# Patient Record
Sex: Male | Born: 1960 | Race: Black or African American | Hispanic: No | Marital: Married | Smoking: Former smoker
Health system: Southern US, Community
[De-identification: ages and names within clinical notes are randomized; demographics above are authoritative.]

## PROBLEM LIST (undated history)

## (undated) DIAGNOSIS — I10 Essential (primary) hypertension: Secondary | ICD-10-CM

## (undated) DIAGNOSIS — C61 Malignant neoplasm of prostate: Secondary | ICD-10-CM

## (undated) HISTORY — PX: OTHER SURGICAL HISTORY: SHX169

## (undated) HISTORY — DX: Malignant neoplasm of prostate: C61

---

## 1999-09-29 ENCOUNTER — Encounter: Admission: RE | Admit: 1999-09-29 | Discharge: 1999-09-29 | Payer: Self-pay | Admitting: *Deleted

## 1999-09-29 ENCOUNTER — Encounter: Payer: Self-pay | Admitting: Infectious Diseases

## 2000-04-17 ENCOUNTER — Emergency Department (HOSPITAL_COMMUNITY): Admission: EM | Admit: 2000-04-17 | Discharge: 2000-04-17 | Payer: Self-pay | Admitting: Emergency Medicine

## 2001-01-25 ENCOUNTER — Encounter: Admission: RE | Admit: 2001-01-25 | Discharge: 2001-01-25 | Payer: Self-pay | Admitting: Family Medicine

## 2001-01-25 ENCOUNTER — Encounter: Payer: Self-pay | Admitting: Family Medicine

## 2001-02-12 ENCOUNTER — Ambulatory Visit (HOSPITAL_COMMUNITY): Admission: RE | Admit: 2001-02-12 | Discharge: 2001-02-12 | Payer: Self-pay | Admitting: *Deleted

## 2001-02-22 ENCOUNTER — Ambulatory Visit (HOSPITAL_COMMUNITY): Admission: RE | Admit: 2001-02-22 | Discharge: 2001-02-22 | Payer: Self-pay | Admitting: *Deleted

## 2001-12-03 ENCOUNTER — Encounter: Payer: Self-pay | Admitting: Endocrinology

## 2001-12-03 ENCOUNTER — Ambulatory Visit (HOSPITAL_COMMUNITY): Admission: RE | Admit: 2001-12-03 | Discharge: 2001-12-03 | Payer: Self-pay | Admitting: Endocrinology

## 2003-07-31 ENCOUNTER — Ambulatory Visit (HOSPITAL_COMMUNITY): Admission: RE | Admit: 2003-07-31 | Discharge: 2003-07-31 | Payer: Self-pay | Admitting: Urology

## 2003-10-13 ENCOUNTER — Ambulatory Visit: Admission: RE | Admit: 2003-10-13 | Discharge: 2004-01-11 | Payer: Self-pay | Admitting: Radiation Oncology

## 2003-12-17 ENCOUNTER — Encounter: Admission: RE | Admit: 2003-12-17 | Discharge: 2003-12-17 | Payer: Self-pay | Admitting: Urology

## 2004-01-22 ENCOUNTER — Ambulatory Visit (HOSPITAL_BASED_OUTPATIENT_CLINIC_OR_DEPARTMENT_OTHER): Admission: RE | Admit: 2004-01-22 | Discharge: 2004-01-22 | Payer: Self-pay | Admitting: Urology

## 2004-01-22 ENCOUNTER — Ambulatory Visit (HOSPITAL_COMMUNITY): Admission: RE | Admit: 2004-01-22 | Discharge: 2004-01-22 | Payer: Self-pay | Admitting: Urology

## 2004-02-12 ENCOUNTER — Ambulatory Visit: Admission: RE | Admit: 2004-02-12 | Discharge: 2004-02-24 | Payer: Self-pay | Admitting: Radiation Oncology

## 2005-05-11 ENCOUNTER — Inpatient Hospital Stay (HOSPITAL_COMMUNITY): Admission: RE | Admit: 2005-05-11 | Discharge: 2005-05-17 | Payer: Self-pay | Admitting: Psychiatry

## 2005-05-12 ENCOUNTER — Ambulatory Visit: Payer: Self-pay | Admitting: Psychiatry

## 2005-09-21 ENCOUNTER — Ambulatory Visit: Admission: RE | Admit: 2005-09-21 | Discharge: 2005-10-13 | Payer: Self-pay | Admitting: Radiation Oncology

## 2005-10-26 ENCOUNTER — Ambulatory Visit: Payer: Self-pay | Admitting: Nurse Practitioner

## 2005-10-31 ENCOUNTER — Emergency Department (HOSPITAL_COMMUNITY): Admission: EM | Admit: 2005-10-31 | Discharge: 2005-11-01 | Payer: Self-pay | Admitting: Emergency Medicine

## 2006-01-04 ENCOUNTER — Ambulatory Visit: Payer: Self-pay | Admitting: Family Medicine

## 2006-04-06 ENCOUNTER — Ambulatory Visit: Payer: Self-pay | Admitting: Nurse Practitioner

## 2006-04-22 ENCOUNTER — Emergency Department (HOSPITAL_COMMUNITY): Admission: EM | Admit: 2006-04-22 | Discharge: 2006-04-22 | Payer: Self-pay | Admitting: Emergency Medicine

## 2006-04-24 ENCOUNTER — Ambulatory Visit: Payer: Self-pay | Admitting: Nurse Practitioner

## 2006-04-26 IMAGING — CR DG CHEST 2V
2 series · 2 of 2 positions shown · non-contrast
Comparison: none

CLINICAL DATA: Ex-smoker, prostate cancer, pre-operative evaluation. 
 TWO VIEW CHEST: 
 Heart size near the upper limit of normal.  Clear lungs.  Mild scoliosis.

[view not recorded (1 of 2)]
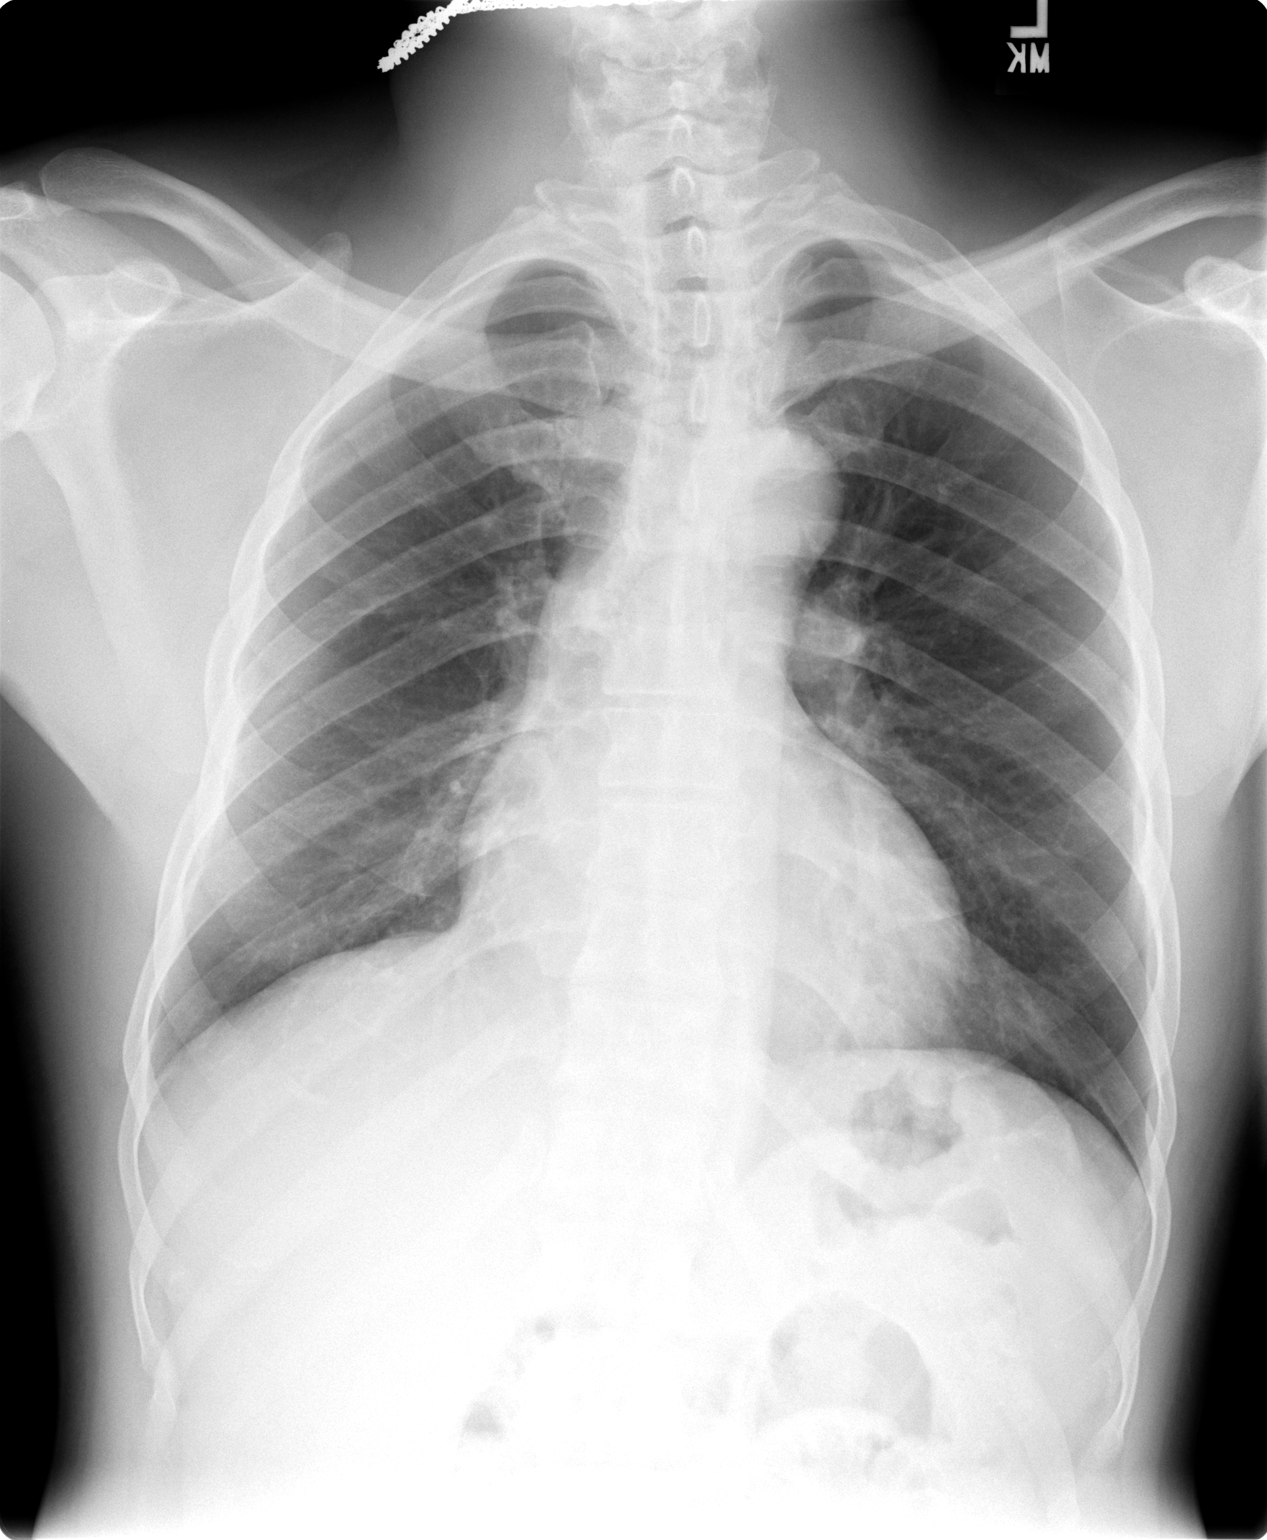

[view not recorded (2 of 2)]
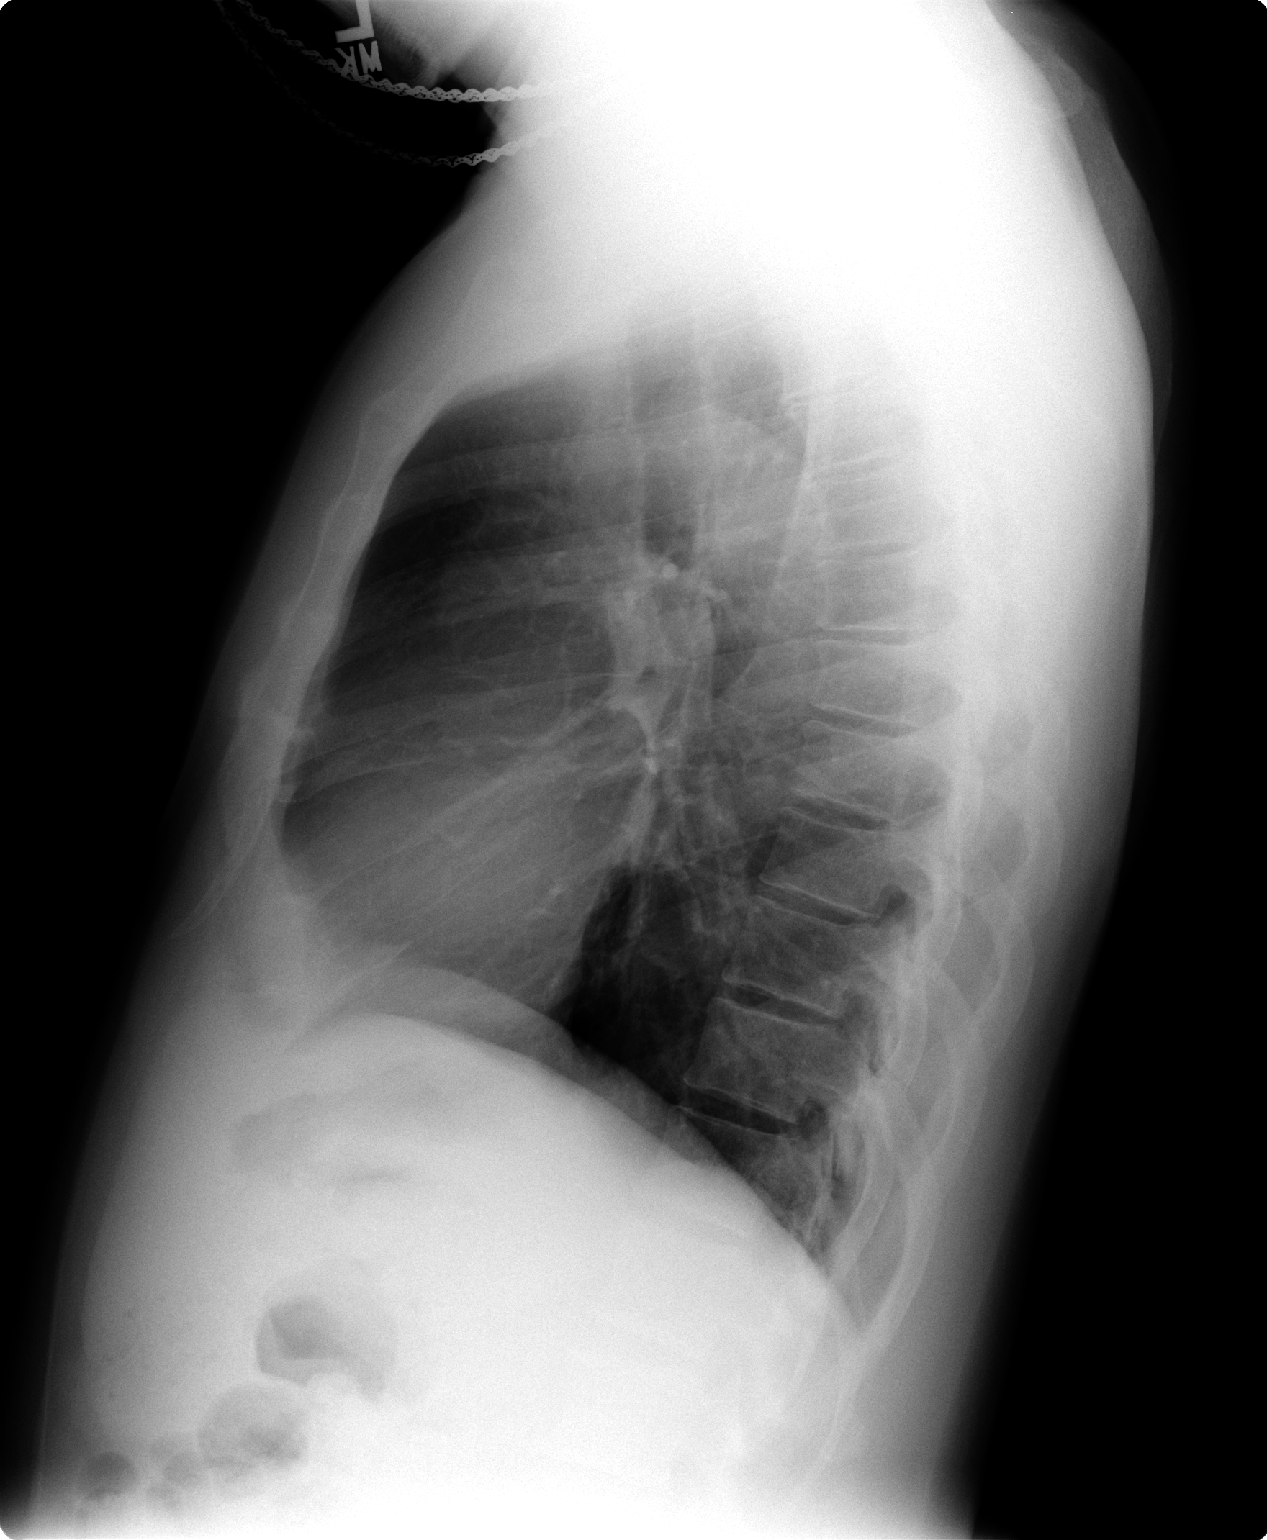

[2 of 2 positions shown; findings below may reference images not displayed]

IMPRESSION: No acute abnormality and no evidence of metastatic disease.

## 2006-09-14 ENCOUNTER — Ambulatory Visit: Payer: Self-pay | Admitting: Nurse Practitioner

## 2006-10-05 ENCOUNTER — Ambulatory Visit: Payer: Self-pay | Admitting: Nurse Practitioner

## 2006-10-19 ENCOUNTER — Ambulatory Visit: Payer: Self-pay | Admitting: Nurse Practitioner

## 2007-10-07 ENCOUNTER — Ambulatory Visit: Payer: Self-pay | Admitting: Internal Medicine

## 2007-10-08 ENCOUNTER — Ambulatory Visit (HOSPITAL_COMMUNITY): Admission: RE | Admit: 2007-10-08 | Discharge: 2007-10-08 | Payer: Self-pay | Admitting: Internal Medicine

## 2007-12-09 ENCOUNTER — Ambulatory Visit: Payer: Self-pay | Admitting: Internal Medicine

## 2007-12-09 ENCOUNTER — Encounter (INDEPENDENT_AMBULATORY_CARE_PROVIDER_SITE_OTHER): Payer: Self-pay | Admitting: Family Medicine

## 2008-01-24 ENCOUNTER — Ambulatory Visit: Payer: Self-pay | Admitting: Family Medicine

## 2008-01-24 LAB — CONVERTED CEMR LAB: Sed Rate: 14 mm/hr (ref 0–16)

## 2008-01-29 ENCOUNTER — Ambulatory Visit: Payer: Self-pay | Admitting: Internal Medicine

## 2008-01-29 ENCOUNTER — Encounter (INDEPENDENT_AMBULATORY_CARE_PROVIDER_SITE_OTHER): Payer: Self-pay | Admitting: Family Medicine

## 2008-01-29 LAB — CONVERTED CEMR LAB
Cholesterol: 143 mg/dL (ref 0–200)
LDL Cholesterol: 87 mg/dL (ref 0–99)
Triglycerides: 132 mg/dL (ref <150)

## 2008-02-07 ENCOUNTER — Ambulatory Visit (HOSPITAL_COMMUNITY): Admission: RE | Admit: 2008-02-07 | Discharge: 2008-02-07 | Payer: Self-pay | Admitting: Family Medicine

## 2008-07-17 ENCOUNTER — Ambulatory Visit: Payer: Self-pay | Admitting: Family Medicine

## 2008-10-13 ENCOUNTER — Encounter (INDEPENDENT_AMBULATORY_CARE_PROVIDER_SITE_OTHER): Payer: Self-pay | Admitting: Family Medicine

## 2008-10-13 ENCOUNTER — Ambulatory Visit: Payer: Self-pay | Admitting: Internal Medicine

## 2008-10-13 LAB — CONVERTED CEMR LAB
ALT: 18 units/L (ref 0–53)
Albumin: 4.3 g/dL (ref 3.5–5.2)
Alkaline Phosphatase: 75 units/L (ref 39–117)
Basophils Absolute: 0 10*3/uL (ref 0.0–0.1)
CO2: 24 meq/L (ref 19–32)
Creatinine, Ser: 0.92 mg/dL (ref 0.40–1.50)
Eosinophils Absolute: 0.2 10*3/uL (ref 0.0–0.7)
HCT: 39.4 % (ref 39.0–52.0)
Hemoglobin: 13.8 g/dL (ref 13.0–17.0)
LDL Cholesterol: 77 mg/dL (ref 0–99)
MCHC: 35 g/dL (ref 30.0–36.0)
Neutrophils Relative %: 44 % (ref 43–77)
PSA: 0.56 ng/mL (ref 0.10–4.00)
Potassium: 3.5 meq/L (ref 3.5–5.3)
RBC: 4.66 M/uL (ref 4.22–5.81)
RDW: 12.6 % (ref 11.5–15.5)
TSH: 1.664 microintl units/mL (ref 0.350–4.500)
Total Protein: 7.1 g/dL (ref 6.0–8.3)
VLDL: 44 mg/dL — ABNORMAL HIGH (ref 0–40)

## 2008-10-16 ENCOUNTER — Ambulatory Visit: Payer: Self-pay | Admitting: Internal Medicine

## 2008-10-16 ENCOUNTER — Encounter: Admission: RE | Admit: 2008-10-16 | Discharge: 2008-10-16 | Payer: Self-pay | Admitting: Pulmonary Disease

## 2009-09-29 ENCOUNTER — Ambulatory Visit (HOSPITAL_COMMUNITY): Admission: RE | Admit: 2009-09-29 | Discharge: 2009-09-29 | Payer: Self-pay | Admitting: Urology

## 2010-07-17 ENCOUNTER — Encounter: Payer: Self-pay | Admitting: Urology

## 2010-11-11 NOTE — Discharge Summary (Signed)
NAME:  Douglas Stewart, Douglas Stewart NO.:  [id number]   MEDICAL RECORD NO.:  [id number]          PATIENT TYPE:  IPS   LOCATION:  0301                          FACILITY:  [facility]   PHYSICIAN:  Geoffery Lyons, M.D.      DATE OF BIRTH:  October 06, 1960   DATE OF ADMISSION:  05/11/2005  DATE OF DISCHARGE:  05/17/2005                                 DISCHARGE SUMMARY   CHIEF COMPLAINT AND PRESENT ILLNESS:  This was the first admission to [facility] for this 50 year old African-American male, single,  voluntarily admitted.  Diagnosed with schizophrenia, paranoid-type.  He has  not been taking his medication as he has not been able to pay for  prescriptions.  Has been wandering around town stating that he was afraid  that they are out to kill him.   PAST PSYCHIATRIC HISTORY:  First time at [facility].  Last time  hospitalized in [place].  Been seen by Dr. Viviann Spare __________ in [place].   ALCOHOL/DRUG HISTORY:  Denies the active use of any substances.   MEDICAL HISTORY:  Chronic constipation, headaches.   MEDICATIONS:  Trilafon 4 mg at night, Remeron 30 mg at night.   PHYSICAL EXAMINATION:  Performed and failed to show any acute findings.   LABORATORY DATA:  CBC with white blood cells 5.2, hemoglobin 13.4.  Blood  chemistry with glucose 100.  Liver enzymes with SGOT 20, SGPT 15.  Drug  screen negative for substances of abuse.   MENTAL STATUS EXAM:  Fully alert, calm, cooperative male. Affect somewhat  blunted.  He was guarded.  Speech slow, decreased amount, soft tone,  pronounced accent.  Mood depressed, guarded.  Thought processes with  thoughts of having people after him to hurt him.  No suicidal ideation.  No  homicidal ideation.  No hallucinations.  Cognition was well-preserved.   ADMISSION DIAGNOSES:  AXIS I:  Schizophrenia, paranoid.  AXIS II:  No diagnosis.  AXIS III:  Headaches.  AXIS IV:  Moderate.  AXIS V:  GAF upon admission 25-30; highest GAF  in the last year 55-60.   HOSPITAL COURSE:  He was admitted.  He was started in individual and group  psychotherapy.  He was given Ambien for sleep.  He was given Risperdal 2 mg  at night.  He was maintained on Remeron 45 mg at bedtime and Trilafon 4 mg  at night, Aciphex 20 mg daily.  Risperdal was placed at Risperdal M-Tab 2 mg  in the morning and 2 mg at night.  He was given Cogentin 1 mg twice a day.  He was given Colace.  He was later started on Zoloft 25 mg per day.  He  endorsed he had been having a very hard time.  He apparently had been  experiencing delusional ideas, thought that people were trying to get him.  He had been inpatient in the last two weeks twice.  Last time, he could not  afford his medications so he did not take them.  We resumed the medication.  We started working with reality testing, helped to  increase insight and  challenged these misperceptions.  By May 16, 2005, it was obvious that  the delusional ideas had been decreasing.  He was overall much better.  On  May 17, 2005, he was sleeping.  He was back on medication.  There was  no spontaneous delusional content.  He had procured a placement, so we went  ahead and discharged to outpatient follow-up.   DISCHARGE DIAGNOSES:  AXIS I:  Schizophrenia, paranoid-type.  AXIS II:  No diagnosis.  AXIS III:  Headaches.  AXIS IV:  Moderate.  AXIS V:  GAF upon discharge 50.   DISCHARGE MEDICATIONS:  1.  Remeron 45 mg at night.  2.  Protonix 40 mg per day.  3.  Cogentin 1 mg twice a day.  4.  Risperdal M-Tab 2 mg in the morning, 2 mg at night.  5.  Zoloft 50 mg per day.   FOLLOW UP:  [facility].      Geoffery Lyons, M.D.  Electronically Signed     IL/MEDQ  D:  05/24/2005  T:  05/24/2005  Job:  [id number]

## 2010-11-11 NOTE — Op Note (Signed)
NAME:  Douglas Stewart, Douglas Stewart NO.:  [id number]   MEDICAL RECORD NO.:  [id number]                   PATIENT TYPE:  AMB   LOCATION:  NESC                                 FACILITY:  [facility]   PHYSICIAN:  Lindaann Slough, M.D.               DATE OF BIRTH:  Feb 19, 1961   DATE OF PROCEDURE:  01/22/2004  DATE OF DISCHARGE:                                 OPERATIVE REPORT   PREOPERATIVE DIAGNOSIS:  Adenocarcinoma of the prostate.   POSTOPERATIVE DIAGNOSIS:  Adenocarcinoma of the prostate.   PROCEDURE:  Seeds implantation.   SURGEON:  Lindaann Slough, M.D.   ASSISTANT SURGEON:  Wynn Banker, M.D.   ANESTHESIA:  General.   INDICATIONS:  Patient is a 50 year old male who was found to have an  elevated PSA at 33.2.  Biopsy of the prostate was positive for  adenocarcinoma, Gleason score 7.  Treatment options were discussed with the  patient, and he chose to have radiation therapy.  He had IMRT from November 25, 2003 through December 30, 2003.  He is now scheduled for brachytherapy.   Under general anesthesia, the patient was prepped and draped and placed in  the dorsal lithotomy position.  An ultrasound of the prostate was done.  When images were identical to the preplanning ultrasound, the transducer was  fixed.  The grid was attached to the transducer, and under fluoroscopic and  ultrasound guidance, the seeds were implanted in the prostate.  A total of  60 palladium seeds were implanted in the prostate.  Then the Foley catheter  that was previously placed in the bladder was removed.  Then the flexible  cystoscope was passed into the bladder.  The urethra is normal.  There is no  evidence of seeds in the bladder.  There is no stone or tumor in the  bladder.  The urethral orifices are in normal position and shape with clear  efflux.  The cystoscope was then removed.  A #16 Foley catheter was then  reinserted in the bladder.   The patient tolerated the procedure well  and left the OR in satisfactory  condition to post anesthesia care unit.                                               Lindaann Slough, M.D.    MN/MEDQ  D:  01/22/2004  T:  01/22/2004  Job:  [id number]   cc:   Fleet Contras, M.D.  [address]  [place]  [place] [address]  Fax: [phone]

## 2012-08-07 ENCOUNTER — Other Ambulatory Visit: Payer: Self-pay | Admitting: Gastroenterology

## 2012-08-07 DIAGNOSIS — R109 Unspecified abdominal pain: Secondary | ICD-10-CM

## 2012-08-12 ENCOUNTER — Ambulatory Visit
Admission: RE | Admit: 2012-08-12 | Discharge: 2012-08-12 | Disposition: A | Discharge status: Home / Self Care | Payer: Medicaid Other | Source: Ambulatory Visit | Attending: Gastroenterology | Admitting: Gastroenterology

## 2012-08-12 DIAGNOSIS — R109 Unspecified abdominal pain: Secondary | ICD-10-CM

## 2012-08-19 ENCOUNTER — Other Ambulatory Visit (HOSPITAL_COMMUNITY): Payer: Self-pay | Admitting: Gastroenterology

## 2012-08-19 DIAGNOSIS — R109 Unspecified abdominal pain: Secondary | ICD-10-CM

## 2012-08-26 ENCOUNTER — Other Ambulatory Visit (HOSPITAL_COMMUNITY): Payer: Medicaid Other

## 2012-09-03 ENCOUNTER — Encounter (HOSPITAL_COMMUNITY)
Admission: RE | Admit: 2012-09-03 | Discharge: 2012-09-03 | Disposition: A | Discharge status: Home / Self Care | Payer: Medicare Other | Source: Ambulatory Visit | Attending: Gastroenterology | Admitting: Gastroenterology

## 2012-09-03 DIAGNOSIS — R109 Unspecified abdominal pain: Secondary | ICD-10-CM | POA: Insufficient documentation

## 2012-09-03 MED ORDER — SINCALIDE 5 MCG IJ SOLR
0.0200 ug/kg | Freq: Once | INTRAMUSCULAR | Status: DC
  Administered 2012-09-03: 1.85 ug via INTRAVENOUS

## 2012-09-03 MED ORDER — TECHNETIUM TC 99M MEBROFENIN IV KIT
5.0000 | PACK | Freq: Once | INTRAVENOUS | Status: AC | PRN
  Administered 2012-09-03: 5 via INTRAVENOUS

## 2015-01-13 ENCOUNTER — Other Ambulatory Visit (HOSPITAL_COMMUNITY): Payer: Self-pay | Admitting: Urology

## 2015-01-13 DIAGNOSIS — C61 Malignant neoplasm of prostate: Secondary | ICD-10-CM

## 2015-01-25 ENCOUNTER — Ambulatory Visit (HOSPITAL_COMMUNITY)
Admission: RE | Admit: 2015-01-25 | Discharge: 2015-01-25 | Disposition: A | Discharge status: Home / Self Care | Payer: Medicare Other | Source: Ambulatory Visit | Attending: Urology | Admitting: Urology

## 2015-01-25 ENCOUNTER — Encounter (HOSPITAL_COMMUNITY)
Admission: RE | Admit: 2015-01-25 | Discharge: 2015-01-25 | Disposition: A | Discharge status: Home / Self Care | Payer: Medicare Other | Source: Ambulatory Visit | Attending: Urology | Admitting: Urology

## 2015-01-25 DIAGNOSIS — C61 Malignant neoplasm of prostate: Secondary | ICD-10-CM | POA: Diagnosis not present

## 2015-01-25 MED ORDER — TECHNETIUM TC 99M MEDRONATE IV KIT
25.7000 | PACK | Freq: Once | INTRAVENOUS | Status: AC | PRN
Start: 2015-01-25 — End: 2015-01-25
  Administered 2015-01-25: 25.7 via INTRAVENOUS

## 2015-05-03 ENCOUNTER — Encounter: Payer: Self-pay | Admitting: Neurology

## 2015-05-03 ENCOUNTER — Encounter: Payer: Self-pay | Admitting: *Deleted

## 2015-05-03 ENCOUNTER — Ambulatory Visit (INDEPENDENT_AMBULATORY_CARE_PROVIDER_SITE_OTHER): Payer: Medicare Other | Admitting: Neurology

## 2015-05-03 VITALS — BP 144/98 | HR 76 | Temp 97.8°F | Wt 205.0 lb

## 2015-05-03 DIAGNOSIS — R270 Ataxia, unspecified: Secondary | ICD-10-CM | POA: Diagnosis not present

## 2015-05-03 DIAGNOSIS — R4781 Slurred speech: Secondary | ICD-10-CM | POA: Insufficient documentation

## 2015-05-03 DIAGNOSIS — R42 Dizziness and giddiness: Secondary | ICD-10-CM | POA: Diagnosis not present

## 2015-05-03 DIAGNOSIS — H938X2 Other specified disorders of left ear: Secondary | ICD-10-CM

## 2015-05-03 DIAGNOSIS — H9192 Unspecified hearing loss, left ear: Secondary | ICD-10-CM

## 2015-05-03 NOTE — Patient Instructions (Signed)
Overall you are doing fairly well but I do want to suggest a few things today:   Remember to drink plenty of fluid, eat healthy meals and do not skip any meals. Try to eat protein with a every meal and eat a healthy snack such as fruit or nuts in between meals. Try to keep a regular sleep-wake schedule and try to exercise daily, particularly in the form of walking, 20-30 minutes a day, if you can.    As far as diagnostic testing: MRi of the brain, ENT evaluation  I would like to see you back after workup, sooner if we need to. Please call us with any interim questions, concerns, problems, updates or refill requests.   Our phone number is [phone]. We also have an after hours call service for urgent matters and there is a physician on-call for urgent questions. For any emergencies you know to call 911 or go to the nearest emergency room

## 2015-05-03 NOTE — Progress Notes (Signed)
[facility]    Provider:  Dr Jaynee Eagles Referring Provider: Wonda Horner, MD Primary Care Physician:  Cassell Clement, MD  CC:  dizziness  HPI:  Douglas Stewart is a 54 y.o. male here as a referral from Dr. Penelope Coop for dizziness.  PMHx of pre-diabetes. Left ear symptoms started about a year ago with left ear pain, roarning int he left ear, decr hearing in the left ear. The balance problems started started 2-3 months ago. Feels off balance, when he gets up from bed feels like he is drunk. It is dizziness, he has left ear pain, hearing loss in the left ear, it sounds like a water falling in the left ear, he has room spinning and dizziness and that is why he feels drunk. It is coming from his ear. When he wakes up in the morning, it happens the most. No problems with turning of head or quick movements. No sinus problems. He symptoms are 2x a week. The symptoms last for 5-10 minutes. He gets on the floor and sits or does push ups and it helps. When he sits the room is spinning like he is drunk and his body can move on its own. He has a problem with balance, he doesn't talk clear, he is having a problem with speech. No numbness or tingling in toes. He has low back pain, no radicular symptoms. No neck pain. Denies headache or focal neurologic deficit.  Reviewed notes, labs and imaging from outside physicians, which showed: CT of the head in 2007 showed:  No acute intracranial abnormalities including mass lesion or mass effect, hydrocephalus, extra-axial fluid collection, midline shift, hemorrhage, or acute infarction, large ischemic events (personally reviewed images)   Review of Systems: Patient complains of symptoms per HPI as well as the following symptoms:hearing loss,ringing in ears. No CP, no SOB. Pertinent negatives per HPI. All others negative.   Social History   Social History  . Marital Status: Married    Spouse Name: Sunday Spillers  . Number of Children: 1  . Years of Education: 12+    Occupational History  . Student     Social History Main Topics  . Smoking status: Former Smoker    Quit date: 06/27/1999  . Smokeless tobacco: Not on file  . Alcohol Use: No  . Drug Use: No  . Sexual Activity: Not on file   Other Topics Concern  . Not on file   Social History Narrative   Lives at home with friends   Caffeine use: Drinks coffee occassionally      Family History  Problem Relation Age of Onset  . Stroke Neg Hx     Past Medical History  Diagnosis Date  . Prostate cancer [facility])     Past Surgical History  Procedure Laterality Date  . No surgical history      No current outpatient prescriptions on file.   No current facility-administered medications for this visit.    Allergies as of 05/03/2015  . (No Known Allergies)    Vitals: BP 144/98 mmHg  Pulse 76  Temp(Src) 97.8 F (36.6 C) (Oral)  Wt 205 lb (92.987 kg) Last Weight:  Wt Readings from Last 1 Encounters:  05/03/15 205 lb (92.987 kg)   Last Height:   Ht Readings from Last 1 Encounters:  No data found for Ht    Physical exam: Exam: Gen: NAD, conversant, well nourised, well groomed  CV: RRR, no MRG. No Carotid Bruits. No peripheral edema, warm, nontender Eyes: Conjunctivae clear without exudates or hemorrhage  Neuro: Detailed Neurologic Exam  Speech:    Speech is normal; fluent and spontaneous with normal comprehension.  Cognition:    The patient is oriented to person, place, and time;     recent and remote memory intact;     language fluent;     normal attention, concentration,     fund of knowledge Cranial Nerves:    The pupils are equal, round, and reactive to light. The fundi are normal and spontaneous venous pulsations are present. Visual fields are full to finger confrontation. Extraocular movements are intact. Trigeminal sensation is intact and the muscles of mastication are normal. The face is symmetric. The palate elevates in the midline. Hearing  intact. Voice is normal. Shoulder shrug is normal. The tongue has normal motion without fasciculations.   Coordination:    Normal finger to nose and heel to shin. Normal rapid alternating movements.   Gait:    Heel-toe gait are normal. Difficulty with tandem.  Motor Observation:    No asymmetry, no atrophy, and no involuntary movements noted. Tone:    Normal muscle tone.    Posture:    Posture is normal. normal erect    Strength:    Strength is V/V in the upper and lower limbs.      Sensation: intact to LT. Rhomberg negative.      Reflex Exam:  DTR's: absent AJs otherwise deep tendon reflexes in the upper and lower extremities are 1+ bilaterally.   Toes:    The toes are downgoing bilaterally.   Clonus:    Clonus is absent.      Assessment/Plan:  54 year old with left ear pain, dec left hearing and roaring in the ear, dizziness and vertigo. MRI of the brain. ENT evaluation.  Sarina Ill, MD  [facility] [address] [place], [place] [address]  Phone [phone] Fax [phone]

## 2015-05-04 ENCOUNTER — Telehealth: Payer: Self-pay | Admitting: Neurology

## 2015-05-04 ENCOUNTER — Encounter: Payer: Self-pay | Admitting: Neurology

## 2015-05-04 ENCOUNTER — Encounter: Payer: Self-pay | Admitting: *Deleted

## 2015-05-04 ENCOUNTER — Telehealth: Payer: Self-pay | Admitting: *Deleted

## 2015-05-04 LAB — BASIC METABOLIC PANEL
BUN / CREAT RATIO: 11 (ref 9–20)
BUN: 10 mg/dL (ref 6–24)
CALCIUM: 9.2 mg/dL (ref 8.7–10.2)
CHLORIDE: 102 mmol/L (ref 97–106)
CO2: 23 mmol/L (ref 18–29)
Creatinine, Ser: 0.94 mg/dL (ref 0.76–1.27)
GFR calc non Af Amer: 91 mL/min/{1.73_m2} (ref >59)
GFR, EST AFRICAN AMERICAN: 105 mL/min/{1.73_m2} (ref >59)
GLUCOSE: 152 mg/dL — AB (ref 65–99)
POTASSIUM: 4.1 mmol/L (ref 3.5–5.2)
Sodium: 138 mmol/L (ref 136–144)

## 2015-05-04 NOTE — Telephone Encounter (Signed)
I placed the referral. Douglas Stewart can you call Dr. Berle Mull office in the morning and see if you can let them know the referral is coming and if they might be able to call him tomorrow? Just my note needs to go into the referral thanks.

## 2015-05-04 NOTE — Telephone Encounter (Signed)
Called pt and advised per Dr Jaynee Eagles that labs were okay. Pt verbalized understanding.

## 2015-05-04 NOTE — Telephone Encounter (Signed)
LVM returning pt call. Gave [facility] phone number and hours.

## 2015-05-04 NOTE — Telephone Encounter (Signed)
-----   Message from Melvenia Beam, MD sent at 05/04/2015  6:41 AM EST ----- Let patient knows labs were ok thanks

## 2015-05-04 NOTE — Telephone Encounter (Signed)
Patient called to check status of ENT referral.

## 2015-05-05 NOTE — Telephone Encounter (Signed)
Called pt to let him know Dr Ernesto Rutherford office should contact him to schedule appt. Also gave office number to Dr Ernesto Rutherford so he can call and schedule appt if he wants.

## 2015-05-05 NOTE — Telephone Encounter (Signed)
LVM for pt to call back about ENT referral. Gave [facility] phone number. Ask pt if he would like to be seen this week with Dr Lucia Gaskins (different provider). They will try to fit him in. Or wait until next week to see Dr Ernesto Rutherford.

## 2015-05-05 NOTE — Telephone Encounter (Signed)
Call patient, I am happy for him to see Dr. Lucia Gaskins if he wants to get in soone rthanks!

## 2015-05-05 NOTE — Telephone Encounter (Signed)
Pt called and says it would have to be the first of the month before he could go and see anyone. He does not care which physician he goes to see. Please call and advise [phone]

## 2015-05-05 NOTE — Telephone Encounter (Signed)
Called and spoke to Shawnee at Dr Ernesto Rutherford office. She said Dr Ernesto Rutherford is out of the office until next week. Dr Waynetta Sandy may be able to work pt in today or tomorrow. They share the same office. Dr Ernesto Rutherford is happy see pt next week, but if Dr Jaynee Eagles wants pt to be seen this week, they can try and fit pt in with Dr Lucia Gaskins. His phone number is: [phone]

## 2015-05-11 ENCOUNTER — Ambulatory Visit: Payer: Medicare Other | Admitting: Family Medicine

## 2015-05-14 ENCOUNTER — Ambulatory Visit
Admission: RE | Admit: 2015-05-14 | Discharge: 2015-05-14 | Disposition: A | Discharge status: Home / Self Care | Payer: Medicare Other | Source: Ambulatory Visit | Attending: Neurology | Admitting: Neurology

## 2015-05-14 DIAGNOSIS — H9192 Unspecified hearing loss, left ear: Secondary | ICD-10-CM

## 2015-05-14 DIAGNOSIS — R42 Dizziness and giddiness: Secondary | ICD-10-CM

## 2015-05-14 DIAGNOSIS — R270 Ataxia, unspecified: Secondary | ICD-10-CM

## 2015-05-14 DIAGNOSIS — R4781 Slurred speech: Secondary | ICD-10-CM

## 2015-05-14 DIAGNOSIS — H938X2 Other specified disorders of left ear: Secondary | ICD-10-CM

## 2015-05-14 MED ORDER — GADOBENATE DIMEGLUMINE 529 MG/ML IV SOLN
19.0000 mL | Freq: Once | INTRAVENOUS | Status: AC | PRN
  Administered 2015-05-14: 19 mL via INTRAVENOUS

## 2015-05-19 ENCOUNTER — Telehealth: Payer: Self-pay

## 2015-05-19 NOTE — Telephone Encounter (Signed)
Had an extensive conversation with pt regarding MRI results. I advised pt that Dr. Jaynee Eagles did not find any causes of his symptoms in the MRI of the brain. She did not see any strokes or lesions or anything concerning. She did see some mild cortical atrophy and white matter changes that are consistent with aging. I advised him to watch his diet, BP, and glucose and keep a tight control of all vascular risk factors.  Pt asked if Dr. Jaynee Eagles still wanted him to see the ENT doctor. I advised him that she does want him to still seen an ENT, and she has put in a referral for him to see Dr. Ernesto Rutherford. He asked what is a good diet? I explained to him to avoid fried foods, fatty foods, and "junk food". I advised him to eat lots of fruits and vegetables along with lean meat such as chicken and fish. I advised him to try nuts and yogurt as well.  I advised him to not fry meats but to bake them. Pt verbalized understanding.

## 2015-05-19 NOTE — Telephone Encounter (Signed)
-----   Message from Melvenia Beam, MD sent at 05/19/2015 10:44 AM EST ----- Sharyn Lull - Please let patient know that we did not find any cause of his symptoms in the MRI of the brain. We did not see any strokes or lesions or anything concerning. We did see some mild cortical atrophy and mild white mater changes that are consistent with aging. I would just advise him (like I advise everyone) to watch his diet, watch his blood pressure and glucose and keep a tight control of all vascular risk factors. Thanks.

## 2015-06-02 ENCOUNTER — Telehealth: Payer: Self-pay | Admitting: Neurology

## 2015-06-02 NOTE — Telephone Encounter (Signed)
Thanks

## 2015-06-02 NOTE — Telephone Encounter (Signed)
[place] office called to inform this pt has c/a appt twice. Today was the 2nd time, he c/a and did not want to r/s. If he does call back to r/s they will be glad to see him

## 2017-03-06 DIAGNOSIS — K5900 Constipation, unspecified: Secondary | ICD-10-CM | POA: Diagnosis not present

## 2017-03-06 DIAGNOSIS — M542 Cervicalgia: Secondary | ICD-10-CM | POA: Diagnosis not present

## 2017-03-06 DIAGNOSIS — E785 Hyperlipidemia, unspecified: Secondary | ICD-10-CM | POA: Diagnosis not present

## 2017-03-06 DIAGNOSIS — E119 Type 2 diabetes mellitus without complications: Secondary | ICD-10-CM | POA: Diagnosis not present

## 2017-03-06 DIAGNOSIS — R51 Headache: Secondary | ICD-10-CM | POA: Diagnosis not present

## 2017-03-14 DIAGNOSIS — R972 Elevated prostate specific antigen [PSA]: Secondary | ICD-10-CM | POA: Diagnosis not present

## 2017-03-28 DIAGNOSIS — Z923 Personal history of irradiation: Secondary | ICD-10-CM | POA: Diagnosis not present

## 2017-03-28 DIAGNOSIS — C61 Malignant neoplasm of prostate: Secondary | ICD-10-CM | POA: Diagnosis not present

## 2017-03-28 DIAGNOSIS — R109 Unspecified abdominal pain: Secondary | ICD-10-CM | POA: Diagnosis not present

## 2017-04-17 DIAGNOSIS — M542 Cervicalgia: Secondary | ICD-10-CM | POA: Diagnosis not present

## 2017-04-17 DIAGNOSIS — J302 Other seasonal allergic rhinitis: Secondary | ICD-10-CM | POA: Diagnosis not present

## 2017-04-17 DIAGNOSIS — E785 Hyperlipidemia, unspecified: Secondary | ICD-10-CM | POA: Diagnosis not present

## 2017-04-17 DIAGNOSIS — E119 Type 2 diabetes mellitus without complications: Secondary | ICD-10-CM | POA: Diagnosis not present

## 2017-04-17 DIAGNOSIS — R51 Headache: Secondary | ICD-10-CM | POA: Diagnosis not present

## 2017-04-17 DIAGNOSIS — K5900 Constipation, unspecified: Secondary | ICD-10-CM | POA: Diagnosis not present

## 2017-04-18 DIAGNOSIS — R972 Elevated prostate specific antigen [PSA]: Secondary | ICD-10-CM | POA: Diagnosis not present

## 2017-04-18 DIAGNOSIS — C61 Malignant neoplasm of prostate: Secondary | ICD-10-CM | POA: Diagnosis not present

## 2017-05-14 DIAGNOSIS — C61 Malignant neoplasm of prostate: Secondary | ICD-10-CM | POA: Diagnosis not present

## 2017-05-23 DIAGNOSIS — R2689 Other abnormalities of gait and mobility: Secondary | ICD-10-CM | POA: Diagnosis not present

## 2017-05-23 DIAGNOSIS — Z0000 Encounter for general adult medical examination without abnormal findings: Secondary | ICD-10-CM | POA: Diagnosis not present

## 2017-05-23 DIAGNOSIS — E119 Type 2 diabetes mellitus without complications: Secondary | ICD-10-CM | POA: Diagnosis not present

## 2017-06-07 DIAGNOSIS — R2681 Unsteadiness on feet: Secondary | ICD-10-CM | POA: Diagnosis not present

## 2017-06-11 DIAGNOSIS — C61 Malignant neoplasm of prostate: Secondary | ICD-10-CM | POA: Diagnosis not present

## 2017-06-11 DIAGNOSIS — R972 Elevated prostate specific antigen [PSA]: Secondary | ICD-10-CM | POA: Diagnosis not present

## 2017-06-11 DIAGNOSIS — N402 Nodular prostate without lower urinary tract symptoms: Secondary | ICD-10-CM | POA: Diagnosis not present

## 2017-07-10 DIAGNOSIS — M2578 Osteophyte, vertebrae: Secondary | ICD-10-CM | POA: Diagnosis not present

## 2017-07-10 DIAGNOSIS — G959 Disease of spinal cord, unspecified: Secondary | ICD-10-CM | POA: Diagnosis not present

## 2017-07-10 DIAGNOSIS — R2689 Other abnormalities of gait and mobility: Secondary | ICD-10-CM | POA: Diagnosis not present

## 2017-07-10 DIAGNOSIS — M50222 Other cervical disc displacement at C5-C6 level: Secondary | ICD-10-CM | POA: Diagnosis not present

## 2017-07-19 DIAGNOSIS — Z5181 Encounter for therapeutic drug level monitoring: Secondary | ICD-10-CM | POA: Diagnosis not present

## 2017-07-19 DIAGNOSIS — Z01118 Encounter for examination of ears and hearing with other abnormal findings: Secondary | ICD-10-CM | POA: Diagnosis not present

## 2017-07-19 DIAGNOSIS — H538 Other visual disturbances: Secondary | ICD-10-CM | POA: Diagnosis not present

## 2017-07-19 DIAGNOSIS — Z0000 Encounter for general adult medical examination without abnormal findings: Secondary | ICD-10-CM | POA: Diagnosis not present

## 2017-07-19 DIAGNOSIS — Z136 Encounter for screening for cardiovascular disorders: Secondary | ICD-10-CM | POA: Diagnosis not present

## 2017-07-19 DIAGNOSIS — E119 Type 2 diabetes mellitus without complications: Secondary | ICD-10-CM | POA: Diagnosis not present

## 2017-07-23 DIAGNOSIS — R2681 Unsteadiness on feet: Secondary | ICD-10-CM | POA: Diagnosis not present

## 2017-07-24 ENCOUNTER — Encounter: Payer: Medicare Other | Admitting: Hematology

## 2017-08-13 DIAGNOSIS — J302 Other seasonal allergic rhinitis: Secondary | ICD-10-CM | POA: Diagnosis not present

## 2017-08-13 DIAGNOSIS — M542 Cervicalgia: Secondary | ICD-10-CM | POA: Diagnosis not present

## 2017-08-13 DIAGNOSIS — E119 Type 2 diabetes mellitus without complications: Secondary | ICD-10-CM | POA: Diagnosis not present

## 2017-08-13 DIAGNOSIS — E785 Hyperlipidemia, unspecified: Secondary | ICD-10-CM | POA: Diagnosis not present

## 2017-08-13 DIAGNOSIS — R51 Headache: Secondary | ICD-10-CM | POA: Diagnosis not present

## 2017-09-05 DIAGNOSIS — R2681 Unsteadiness on feet: Secondary | ICD-10-CM | POA: Diagnosis not present

## 2017-09-05 DIAGNOSIS — M5030 Other cervical disc degeneration, unspecified cervical region: Secondary | ICD-10-CM | POA: Diagnosis not present

## 2017-09-10 DIAGNOSIS — M542 Cervicalgia: Secondary | ICD-10-CM | POA: Diagnosis not present

## 2017-09-10 DIAGNOSIS — E119 Type 2 diabetes mellitus without complications: Secondary | ICD-10-CM | POA: Diagnosis not present

## 2017-09-10 DIAGNOSIS — J302 Other seasonal allergic rhinitis: Secondary | ICD-10-CM | POA: Diagnosis not present

## 2017-09-10 DIAGNOSIS — E785 Hyperlipidemia, unspecified: Secondary | ICD-10-CM | POA: Diagnosis not present

## 2017-09-10 DIAGNOSIS — R42 Dizziness and giddiness: Secondary | ICD-10-CM | POA: Diagnosis not present

## 2017-09-15 ENCOUNTER — Emergency Department (HOSPITAL_COMMUNITY)
Admission: EM | Admit: 2017-09-15 | Discharge: 2017-09-15 | Disposition: A | Discharge status: Home / Self Care | Payer: Medicare Other | Attending: Emergency Medicine | Admitting: Emergency Medicine

## 2017-09-15 ENCOUNTER — Encounter (HOSPITAL_COMMUNITY): Payer: Self-pay | Admitting: Emergency Medicine

## 2017-09-15 ENCOUNTER — Other Ambulatory Visit: Payer: Self-pay

## 2017-09-15 ENCOUNTER — Emergency Department (HOSPITAL_COMMUNITY): Payer: Medicare Other

## 2017-09-15 DIAGNOSIS — J209 Acute bronchitis, unspecified: Secondary | ICD-10-CM

## 2017-09-15 DIAGNOSIS — R0602 Shortness of breath: Secondary | ICD-10-CM | POA: Diagnosis not present

## 2017-09-15 DIAGNOSIS — R062 Wheezing: Secondary | ICD-10-CM | POA: Diagnosis not present

## 2017-09-15 DIAGNOSIS — Z87891 Personal history of nicotine dependence: Secondary | ICD-10-CM | POA: Diagnosis not present

## 2017-09-15 DIAGNOSIS — I10 Essential (primary) hypertension: Secondary | ICD-10-CM | POA: Insufficient documentation

## 2017-09-15 DIAGNOSIS — R05 Cough: Secondary | ICD-10-CM | POA: Diagnosis not present

## 2017-09-15 HISTORY — DX: Essential (primary) hypertension: I10

## 2017-09-15 MED ORDER — AZITHROMYCIN 250 MG PO TABS
500.0000 mg | ORAL_TABLET | Freq: Once | ORAL | Status: AC
  Administered 2017-09-15: 500 mg via ORAL
  Filled 2017-09-15: qty 2

## 2017-09-15 MED ORDER — AZITHROMYCIN 250 MG PO TABS: 250.0000 mg | ORAL_TABLET | Freq: Every day | ORAL | 0 refills | Status: DC

## 2017-09-15 MED ORDER — DEXTROMETHORPHAN-GUAIFENESIN 10-100 MG/5ML PO SYRP: 5.0000 mL | ORAL_SOLUTION | Freq: Two times a day (BID) | ORAL | 0 refills | Status: DC

## 2017-09-15 NOTE — ED Notes (Signed)
Patient verbalizes understanding of discharge instructions. Opportunity for questioning and answers were provided. Armband removed by staff, pt discharged from ED.  

## 2017-09-15 NOTE — ED Provider Notes (Signed)
[place] EMERGENCY DEPARTMENT Provider Note   CSN: [id number] Arrival date & time: 09/15/17  1343     History   Chief Complaint Chief Complaint  Patient presents with  . Cough    HPI Douglas Stewart is a 57 y.o. male.  Pt presents to the ED today with cough for a week.  He has been coughing up green mucous.  He has had fever at home.  He has taken thera flu otc for sx which did not help.  Pt has been unable to sleep due to the cough.     Past Medical History:  Diagnosis Date  . Hypertension   . Prostate cancer [facility])     Patient Active Problem List   Diagnosis Date Noted  . Slurred speech 05/03/2015  . Vertigo 05/03/2015  . Ataxia 05/03/2015  . Dizziness 05/03/2015  . Hearing loss in left ear 05/03/2015  . Sensation of fullness in left ear 05/03/2015    Past Surgical History:  Procedure Laterality Date  . no surgical history          Home Medications    Prior to Admission medications   Medication Sig Start Date End Date Taking? Authorizing Provider  azithromycin (ZITHROMAX Z-PAK) 250 MG tablet Take 1 tablet (250 mg total) by mouth daily. 09/16/17   Isla Pence, MD  Dextromethorphan-guaiFENesin [facility] SUGAR FREE) 10-100 MG/5ML liquid Take 5 mLs by mouth every 12 (twelve) hours. 09/15/17   Isla Pence, MD    Family History Family History  Problem Relation Age of Onset  . Stroke Neg Hx     Social History Social History   Tobacco Use  . Smoking status: Former Smoker    Last attempt to quit: 06/27/1999    Years since quitting: 18.2  Substance Use Topics  . Alcohol use: No    Alcohol/week: 0.0 oz  . Drug use: No     Allergies   Patient has no known allergies.   Review of Systems Review of Systems  Respiratory: Positive for cough and shortness of breath.   All other systems reviewed and are negative.    Physical Exam Updated Vital Signs BP (!) 155/102 (BP Location: Left Arm)   Pulse 87   Temp 98.7 F (37.1  C) (Oral)   Resp 18   Ht 5\' 6"  (1.676 m)   Wt 89.4 kg (197 lb)   SpO2 97%   BMI 31.80 kg/m   Physical Exam  Constitutional: He is oriented to person, place, and time. He appears well-developed and well-nourished.  HENT:  Head: Normocephalic and atraumatic.  Right Ear: External ear normal.  Left Ear: External ear normal.  Nose: Nose normal.  Mouth/Throat: Oropharynx is clear and moist.  Eyes: Pupils are equal, round, and reactive to light. Conjunctivae and EOM are normal.  Neck: Normal range of motion. Neck supple.  Cardiovascular: Normal rate, regular rhythm, normal heart sounds and intact distal pulses.  Pulmonary/Chest: Effort normal. He has wheezes.  Abdominal: Soft. Bowel sounds are normal.  Musculoskeletal: Normal range of motion.  Neurological: He is alert and oriented to person, place, and time.  Skin: Skin is warm. Capillary refill takes less than 2 seconds.  Psychiatric: He has a normal mood and affect. His behavior is normal. Judgment and thought content normal.  Nursing note and vitals reviewed.    ED Treatments / Results  Labs (all labs ordered are listed, but only abnormal results are displayed) Labs Reviewed - No data to display  EKG None  Radiology Dg Chest 2 View  Result Date: 09/15/2017 CLINICAL DATA:  Cough and flu-like symptoms. EXAM: CHEST - 2 VIEW COMPARISON:  10/16/2008. FINDINGS: Trachea is midline. Heart size normal. Lungs are clear. No pleural fluid. Degenerative changes in the spine. IMPRESSION: No acute findings. Electronically Signed   By: Lorin Picket M.D.   On: 09/15/2017 15:01    Procedures Procedures (including critical care time)  Medications Ordered in ED Medications  azithromycin (ZITHROMAX) tablet 500 mg (has no administration in time range)     Initial Impression / Assessment and Plan / ED Course  I have reviewed the triage vital signs and the nursing notes.  Pertinent labs & imaging results that were available during my  care of the patient were reviewed by me and considered in my medical decision making (see chart for details).    Pt has had productive cough for 1 week.  He will be placed on zithromax and given rx for sugar free robitussin.  He knows to return if worse and to f/u with pcp.  Final Clinical Impressions(s) / ED Diagnoses   Final diagnoses:  Acute bronchitis, unspecified organism    ED Discharge Orders        Ordered    azithromycin (ZITHROMAX Z-PAK) 250 MG tablet  Daily     09/15/17 1515    Dextromethorphan-guaiFENesin (ROBITUSSIN SUGAR FREE) 10-100 MG/5ML liquid  Every 12 hours     09/15/17 1515       Isla Pence, MD 09/15/17 1516

## 2017-09-15 NOTE — ED Triage Notes (Signed)
Pt states a cough for 1 week with green mucous, no fevers. Sometimes his throat hurts with cough. 97% on room air. Afebrile.

## 2017-09-17 ENCOUNTER — Other Ambulatory Visit: Payer: Self-pay

## 2017-09-17 NOTE — Patient Outreach (Signed)
Attempted to call patient per recent ED visit. Unable to leave message. CMA will send unsuccessful letter, 24 hour nurse line magnet, and know before you go on 09/17/17.  [place] Management Assistant

## 2017-10-11 DIAGNOSIS — C61 Malignant neoplasm of prostate: Secondary | ICD-10-CM | POA: Diagnosis not present

## 2017-10-23 DIAGNOSIS — M4802 Spinal stenosis, cervical region: Secondary | ICD-10-CM | POA: Diagnosis not present

## 2017-10-23 DIAGNOSIS — M545 Low back pain: Secondary | ICD-10-CM | POA: Diagnosis not present

## 2017-10-23 DIAGNOSIS — R030 Elevated blood-pressure reading, without diagnosis of hypertension: Secondary | ICD-10-CM | POA: Diagnosis not present

## 2017-10-24 DIAGNOSIS — M545 Low back pain: Secondary | ICD-10-CM | POA: Diagnosis not present

## 2017-10-24 DIAGNOSIS — M4726 Other spondylosis with radiculopathy, lumbar region: Secondary | ICD-10-CM | POA: Diagnosis not present

## 2017-11-12 DIAGNOSIS — M4802 Spinal stenosis, cervical region: Secondary | ICD-10-CM | POA: Diagnosis not present

## 2017-11-12 DIAGNOSIS — M4807 Spinal stenosis, lumbosacral region: Secondary | ICD-10-CM | POA: Diagnosis not present

## 2017-11-12 DIAGNOSIS — M4726 Other spondylosis with radiculopathy, lumbar region: Secondary | ICD-10-CM | POA: Diagnosis not present

## 2017-11-12 DIAGNOSIS — R030 Elevated blood-pressure reading, without diagnosis of hypertension: Secondary | ICD-10-CM | POA: Diagnosis not present

## 2017-11-14 ENCOUNTER — Other Ambulatory Visit: Payer: Self-pay

## 2017-11-14 NOTE — Patient Outreach (Signed)
[facility]) Care Management  11/14/2017  Osias Resnick Dralle 03/06/1961 [id number]   Medication Adherence call to Mr. Melinda Pottinger spoke with patient he was not sure if he is to be on Glipizide Er 5 mg told patient, I will get in touch with Doctor Smith Mince to see if he is still on this medication Doctors office said patient has an appointment tomorrow 11/15/17 and they will go over all his medication they also said patient has miss some appointments.    [place] Management Direct Dial [phone]  Fax [phone] [email or web address]

## 2017-11-15 DIAGNOSIS — K5900 Constipation, unspecified: Secondary | ICD-10-CM | POA: Diagnosis not present

## 2017-11-15 DIAGNOSIS — E119 Type 2 diabetes mellitus without complications: Secondary | ICD-10-CM | POA: Diagnosis not present

## 2017-11-15 DIAGNOSIS — J302 Other seasonal allergic rhinitis: Secondary | ICD-10-CM | POA: Diagnosis not present

## 2017-11-15 DIAGNOSIS — E785 Hyperlipidemia, unspecified: Secondary | ICD-10-CM | POA: Diagnosis not present

## 2017-12-24 DIAGNOSIS — E0800 Diabetes mellitus due to underlying condition with hyperosmolarity without nonketotic hyperglycemic-hyperosmolar coma (NKHHC): Secondary | ICD-10-CM | POA: Diagnosis not present

## 2017-12-24 DIAGNOSIS — Z01812 Encounter for preprocedural laboratory examination: Secondary | ICD-10-CM | POA: Diagnosis not present

## 2017-12-31 DIAGNOSIS — M4807 Spinal stenosis, lumbosacral region: Secondary | ICD-10-CM | POA: Diagnosis not present

## 2018-01-04 DIAGNOSIS — R002 Palpitations: Secondary | ICD-10-CM | POA: Diagnosis not present

## 2018-01-04 DIAGNOSIS — K5900 Constipation, unspecified: Secondary | ICD-10-CM | POA: Diagnosis not present

## 2018-01-04 DIAGNOSIS — J302 Other seasonal allergic rhinitis: Secondary | ICD-10-CM | POA: Diagnosis not present

## 2018-01-04 DIAGNOSIS — E119 Type 2 diabetes mellitus without complications: Secondary | ICD-10-CM | POA: Diagnosis not present

## 2018-01-04 DIAGNOSIS — E785 Hyperlipidemia, unspecified: Secondary | ICD-10-CM | POA: Diagnosis not present

## 2018-01-27 ENCOUNTER — Emergency Department (HOSPITAL_COMMUNITY): Payer: Medicare Other

## 2018-01-27 ENCOUNTER — Emergency Department (HOSPITAL_COMMUNITY)
Admission: EM | Admit: 2018-01-27 | Discharge: 2018-01-27 | Disposition: A | Discharge status: Home / Self Care | Payer: Medicare Other | Attending: Emergency Medicine | Admitting: Emergency Medicine

## 2018-01-27 ENCOUNTER — Other Ambulatory Visit: Payer: Self-pay

## 2018-01-27 ENCOUNTER — Encounter (HOSPITAL_COMMUNITY): Payer: Self-pay | Admitting: Emergency Medicine

## 2018-01-27 DIAGNOSIS — I10 Essential (primary) hypertension: Secondary | ICD-10-CM | POA: Insufficient documentation

## 2018-01-27 DIAGNOSIS — Z87891 Personal history of nicotine dependence: Secondary | ICD-10-CM | POA: Insufficient documentation

## 2018-01-27 DIAGNOSIS — Z79899 Other long term (current) drug therapy: Secondary | ICD-10-CM | POA: Insufficient documentation

## 2018-01-27 DIAGNOSIS — R079 Chest pain, unspecified: Secondary | ICD-10-CM | POA: Insufficient documentation

## 2018-01-27 LAB — CBC
HEMATOCRIT: 41 % (ref 39.0–52.0)
HEMOGLOBIN: 13.6 g/dL (ref 13.0–17.0)
MCH: 30.4 pg (ref 26.0–34.0)
MCHC: 33.2 g/dL (ref 30.0–36.0)
MCV: 91.7 fL (ref 78.0–100.0)
Platelets: 202 10*3/uL (ref 150–400)
RBC: 4.47 MIL/uL (ref 4.22–5.81)
RDW: 10.7 % — AB (ref 11.5–15.5)
WBC: 3.5 10*3/uL — AB (ref 4.0–10.5)

## 2018-01-27 LAB — BASIC METABOLIC PANEL
ANION GAP: 9 (ref 5–15)
BUN: 12 mg/dL (ref 6–20)
CHLORIDE: 102 mmol/L (ref 98–111)
CO2: 27 mmol/L (ref 22–32)
Calcium: 9 mg/dL (ref 8.9–10.3)
Creatinine, Ser: 1.07 mg/dL (ref 0.61–1.24)
GFR calc Af Amer: >60 mL/min (ref >60)
GFR calc non Af Amer: >60 mL/min (ref >60)
Glucose, Bld: 189 mg/dL — ABNORMAL HIGH (ref 70–99)
POTASSIUM: 4.1 mmol/L (ref 3.5–5.1)
SODIUM: 138 mmol/L (ref 135–145)

## 2018-01-27 LAB — I-STAT TROPONIN, ED
TROPONIN I, POC: 0.01 ng/mL (ref 0.00–0.08)
Troponin i, poc: 0 ng/mL (ref 0.00–0.08)

## 2018-01-27 NOTE — ED Provider Notes (Signed)
[place] EMERGENCY DEPARTMENT Provider Note   CSN: [id number] Arrival date & time: 01/27/18  1353     History   Chief Complaint Chief Complaint  Patient presents with  . Chest Pain    HPI Douglas Stewart is a 57 y.o. male.  HPI Pt has been having palpitations and pain in his chest in the last week and a half.  Pt states he has pounding sensation on the right chest.  It is painful.  It comes and goes.lasting a few minutes at a time.  He denies shortness of breath.  No nausea or shortness of breath.  No diaphoresis.  Nothing seems to make it worse.  Exercising doesn't make it better but it doesn't make it worse.  Pt just started a new medication metformin and wonders if that could be causing it. Past Medical History:  Diagnosis Date  . Hypertension   . Prostate cancer [facility])     Patient Active Problem List   Diagnosis Date Noted  . Slurred speech 05/03/2015  . Vertigo 05/03/2015  . Ataxia 05/03/2015  . Dizziness 05/03/2015  . Hearing loss in left ear 05/03/2015  . Sensation of fullness in left ear 05/03/2015    Past Surgical History:  Procedure Laterality Date  . no surgical history          Home Medications    Prior to Admission medications   Medication Sig Start Date End Date Taking? Authorizing Provider  metFORMIN (GLUCOPHAGE) 500 MG tablet Take 500 mg by mouth 2 (two) times daily with a meal.   Yes [provider]  propranolol (INDERAL) 20 MG tablet Take 20 mg by mouth 2 (two) times daily. 01/04/18  Yes [provider]  Dextromethorphan-guaiFENesin (ROBITUSSIN SUGAR FREE) 10-100 MG/5ML liquid Take 5 mLs by mouth every 12 (twelve) hours. Patient not taking: Reported on 01/27/2018 09/15/17   Isla Pence, MD    Family History Family History  Problem Relation Age of Onset  . Stroke Neg Hx   NO hx of heart disease  Social History Social History   Tobacco Use  . Smoking status: Former Smoker    Last attempt to quit:  06/27/1999    Years since quitting: 18.6  . Smokeless tobacco: Never Used  Substance Use Topics  . Alcohol use: No    Alcohol/week: 0.0 oz  . Drug use: No     Allergies   Patient has no known allergies.   Review of Systems Review of Systems  All other systems reviewed and are negative.    Physical Exam Updated Vital Signs BP (!) 136/99   Pulse 63   Temp 98.4 F (36.9 C) (Oral)   Resp 16   SpO2 100%   Physical Exam  Constitutional: He appears well-developed and well-nourished. No distress.  HENT:  Head: Normocephalic and atraumatic.  Right Ear: External ear normal.  Left Ear: External ear normal.  Eyes: Conjunctivae are normal. Right eye exhibits no discharge. Left eye exhibits no discharge. No scleral icterus.  Neck: Neck supple. No tracheal deviation present.  Cardiovascular: Normal rate, regular rhythm and intact distal pulses.  Pulmonary/Chest: Effort normal and breath sounds normal. No stridor. No respiratory distress. He has no wheezes. He has no rales.  Abdominal: Soft. Bowel sounds are normal. He exhibits no distension. There is no tenderness. There is no rebound and no guarding.  Musculoskeletal: He exhibits no edema or tenderness.  Neurological: He is alert. He has normal strength. No cranial nerve deficit (no  facial droop, extraocular movements intact, no slurred speech) or sensory deficit. He exhibits normal muscle tone. He displays no seizure activity. Coordination normal.  Skin: Skin is warm and dry. No rash noted.  Psychiatric: He has a normal mood and affect.  Nursing note and vitals reviewed.    ED Treatments / Results  Labs (all labs ordered are listed, but only abnormal results are displayed) Labs Reviewed  BASIC METABOLIC PANEL - Abnormal; Notable for the following components:      Result Value   Glucose, Bld 189 (*)    All other components within normal limits  CBC - Abnormal; Notable for the following components:   WBC 3.5 (*)    RDW 10.7  (*)    All other components within normal limits  I-STAT TROPONIN, ED  I-STAT TROPONIN, ED    EKG EKG Interpretation  Date/Time:  Sunday January 27 2018 13:59:09 EDT Ventricular Rate:  79 PR Interval:  152 QRS Duration: 78 QT Interval:  358 QTC Calculation: 410 R Axis:   6 Text Interpretation:  Normal sinus rhythm Minimal voltage criteria for LVH, may be normal variant Borderline ECG No significant change since last tracing Confirmed by Dorie Rank [phone]) on 01/27/2018 3:19:03 PM   Radiology Dg Chest 2 View  Result Date: 01/27/2018 CLINICAL DATA:  Pt presents to ED for assessment of central and right sided chest pain with a "pounding" sensation x 2 weeks. Patient states he started Metformin in late June and believe it's a side effect of that. Patient c/o intermittent SOB. EXAM: CHEST - 2 VIEW COMPARISON:  09/15/2017 FINDINGS: The heart size and mediastinal contours are within normal limits. Both lungs are clear. Degenerative changes are seen in thoracic spine. IMPRESSION: No active cardiopulmonary disease. Electronically Signed   By: Nolon Nations M.D.   On: 01/27/2018 14:50    Procedures Procedures (including critical care time)  Medications Ordered in ED Medications - No data to display   Initial Impression / Assessment and Plan / ED Course  I have reviewed the triage vital signs and the nursing notes.  Pertinent labs & imaging results that were available during my care of the patient were reviewed by me and considered in my medical decision making (see chart for details).  Clinical Course as of Jan 28 1848  Nancy Fetter Jan 27, 2018  [place] are normal.  No acute findings on chest x-ray.   [JK]    Clinical Course User Index [JK] Dorie Rank, MD    Patient presented to ED for evaluation of chest pounding.  Symptoms are atypical for acute cardiac ischemia.  There is no exertional component.  Patient does not have any symptoms suggest PE.  Patient appears comfortable in the  emergency room.  His ED work-up is reassuring.  Patient is low risk heart score.    I doubt ACS, PE, dissection or other emergent etiology.  Patient is concerned that his medications may be contributing to his symptoms.  I recommended he contact his primary doctor to discuss whether or not to switch to a different medication although I do not think the metformin is necessarily the cause.  Patient can take Tylenol as needed for pain and discomfort. Final Clinical Impressions(s) / ED Diagnoses   Final diagnoses:  Chest pain, unspecified type    ED Discharge Orders    None       Dorie Rank, MD 01/27/18 1850

## 2018-01-27 NOTE — ED Triage Notes (Signed)
Pt presents to ED for assessment of central and right sided chest pain with a "pounding" sensation x 2 weeks.  Patient states he started Metformin in late June and believe it's a side effect of that.  Patient c/o intermittent SOB, nausea, and light-headedness.

## 2018-01-27 NOTE — ED Notes (Signed)
Patient given bag meal with drink. 

## 2018-01-27 NOTE — Discharge Instructions (Addendum)
Follow-up with your primary care doctor as we discussed, take Tylenol as needed for pain and discomfort, return as needed for worsening symptoms

## 2018-01-31 DIAGNOSIS — M4807 Spinal stenosis, lumbosacral region: Secondary | ICD-10-CM | POA: Diagnosis not present

## 2018-02-01 DIAGNOSIS — R002 Palpitations: Secondary | ICD-10-CM | POA: Diagnosis not present

## 2018-02-01 DIAGNOSIS — J302 Other seasonal allergic rhinitis: Secondary | ICD-10-CM | POA: Diagnosis not present

## 2018-02-01 DIAGNOSIS — E785 Hyperlipidemia, unspecified: Secondary | ICD-10-CM | POA: Diagnosis not present

## 2018-02-01 DIAGNOSIS — E119 Type 2 diabetes mellitus without complications: Secondary | ICD-10-CM | POA: Diagnosis not present

## 2018-02-01 DIAGNOSIS — R0789 Other chest pain: Secondary | ICD-10-CM | POA: Diagnosis not present

## 2018-02-01 DIAGNOSIS — K219 Gastro-esophageal reflux disease without esophagitis: Secondary | ICD-10-CM | POA: Diagnosis not present

## 2018-02-01 DIAGNOSIS — K5900 Constipation, unspecified: Secondary | ICD-10-CM | POA: Diagnosis not present

## 2018-04-03 NOTE — Progress Notes (Signed)
 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  Above per [facility] records and patient- patient poor historian  HPI: Patient was seen by [facility] urology in 2018 for PSA recurrence after XRT.  He had a biopsy which showed recurrence and CT and bone scan in 03/2017 did not show evidence of metastatic recurrence. He saw Dr. Louise for prostate cryoablation but elected not to proceed.  He has not  Had any treatment since the local therapy in 2006.  He has no complaints but would like to follow up on his prostate ca and elevated PSA.  PSA 09/2017 11.4 02/2017  9.1  Performance Status: ECOG 0  ROS: A Complete ROS was performed and negative if not mentioned above.  PMHx: Past Medical History:  Diagnosis Date  . DM (diabetes mellitus) (HCC) 12/24/2017  . History of gastroesophageal reflux (GERD) 12/24/2017  . Obesity (BMI 30-39.9) 12/24/2017  . Prostate cancer (HCC) 12/24/2017    ALLERGIES: Allergies as of 04/03/2018  . (No Known Allergies)    MEDS: Meds Ordered in [place]  Medication Sig Dispense Refill  . glipiZIDE XL (GLUCOTROL XL) 5 MG 24 hr tablet take 1 tablet by mouth daily  1  . metFORMIN (GLUCOPHAGE) 500 MG tablet take 1 tablet by mouth twice a day  3  . neomycin-bacitracin-polymyxin (NEOSPORIN) 3.5mg -400 unit- 5,000 unit/gram ointment Apply 1 application topically as needed (SKIN INFECTION).    SABRA propranolol (INDERAL) 20 MG tablet TK 1 T PO  BID  1   No current Epic-ordered facility-administered medications on file.     SOCIAL HISTORY: Social History   Socioeconomic History  . Marital status: Married    Spouse name: Not on file  . Number of children: Not on file  . Years of education: Not on file  . Highest education level: Not on file  Occupational  History  . Not on file  Social Needs  . Financial resource strain: Not on file  . Food insecurity:    Worry: Not on file    Inability: Not on file  . Transportation needs:    Medical: Not on file    Non-medical: Not on file  Tobacco Use  . Smoking status: Former Games developer  . Smokeless tobacco: Never Used  Substance and Sexual Activity  . Alcohol use: No  . Drug use: No  . Sexual activity: Not on file  Lifestyle  . Physical activity:    Days per week: Not on file    Minutes per session: Not on file  . Stress: Not on file  Relationships  . Social connections:    Talks on phone: Not on file    Gets together: Not on file    Attends religious service: Not on file    Active member of club or organization: Not on file    Attends meetings of clubs or organizations: Not on file    Relationship status: Not on file  Other Topics Concern  . Not on file  Social History Narrative  . Not on file  his wife lives in [place] He is unemployed right now, he is on disability No tobb/etoh/illicits  FAMILY HISTORY: Family History  Problem Relation Age of Onset  . Anesthesia problems Neg Hx     PHYSICAL EXAM: Vitals reviewed GENERAL: Well developed, no acute distress. HEENT:  No conjunctival  pallor, without icterus, pupils equal and reactive LYMPHATIC: No cervical, supraclavicular, axillary adenopathy or inguinal adenopathy CARDIOVASCULAR: Regular rate and rhythm without any murmurs, rubs or gallops.  2+ pulse Lungs: non-labored, equal and clear breath sounds bilaterally without anywheezes, rhonchi or crackles. ABDOMEN: Nondistended and there were normal active bowel sounds. No tenderness, no organomegaly EXTREMITIES: Showed no clubbing, cyanosis or edema. SKIN: Showed no bruises or rash. NEUROLOGIC: Alert and oriented x3, good insight.  CN II-XII grossly intact, no sensory defecit, gait was normal and strength was intact.  IMPRESSION and PLAN: PC-biochemical recurrence after  brachytherapy- will get axumin  PET, repeat PSA and see back in 3-4 weeks to discuss treatment options.  He has limited resources so saw student Nola with [employer] program. f/u Schedule pet scan Lab today, see me again in 3-4 weeks  Thank you again for referring Mr. Neisen to our clinic.  If you have any questions or concerns regarding our plan or Mr. Foerster case, please do not hesitate to contact us .   Ozell CHRISTELLA Eagles, MD 1:09 PM 04/03/2018  No orders of the defined types were placed in this encounter.             Electronically signed by: Ozell Jolynn Eagles, MD 04/03/18 1415

## 2018-04-16 DIAGNOSIS — R911 Solitary pulmonary nodule: Secondary | ICD-10-CM | POA: Diagnosis not present

## 2018-05-28 DIAGNOSIS — Z0001 Encounter for general adult medical examination with abnormal findings: Secondary | ICD-10-CM | POA: Diagnosis not present

## 2018-05-28 DIAGNOSIS — E785 Hyperlipidemia, unspecified: Secondary | ICD-10-CM | POA: Diagnosis not present

## 2018-05-28 DIAGNOSIS — Z23 Encounter for immunization: Secondary | ICD-10-CM | POA: Diagnosis not present

## 2018-05-28 DIAGNOSIS — E119 Type 2 diabetes mellitus without complications: Secondary | ICD-10-CM | POA: Diagnosis not present

## 2018-05-28 DIAGNOSIS — J302 Other seasonal allergic rhinitis: Secondary | ICD-10-CM | POA: Diagnosis not present

## 2018-07-10 DIAGNOSIS — Z87891 Personal history of nicotine dependence: Secondary | ICD-10-CM | POA: Diagnosis not present

## 2018-07-10 DIAGNOSIS — R911 Solitary pulmonary nodule: Secondary | ICD-10-CM | POA: Diagnosis not present

## 2018-10-15 DIAGNOSIS — Z01021 Encounter for examination of eyes and vision following failed vision screening with abnormal findings: Secondary | ICD-10-CM | POA: Diagnosis not present

## 2018-10-15 DIAGNOSIS — Z01118 Encounter for examination of ears and hearing with other abnormal findings: Secondary | ICD-10-CM | POA: Diagnosis not present

## 2018-10-15 DIAGNOSIS — Z1389 Encounter for screening for other disorder: Secondary | ICD-10-CM | POA: Diagnosis not present

## 2018-10-15 DIAGNOSIS — Z5181 Encounter for therapeutic drug level monitoring: Secondary | ICD-10-CM | POA: Diagnosis not present

## 2018-10-15 DIAGNOSIS — Z0001 Encounter for general adult medical examination with abnormal findings: Secondary | ICD-10-CM | POA: Diagnosis not present

## 2018-10-15 DIAGNOSIS — Z1329 Encounter for screening for other suspected endocrine disorder: Secondary | ICD-10-CM | POA: Diagnosis not present

## 2018-10-15 DIAGNOSIS — Z131 Encounter for screening for diabetes mellitus: Secondary | ICD-10-CM | POA: Diagnosis not present

## 2018-10-15 DIAGNOSIS — E1165 Type 2 diabetes mellitus with hyperglycemia: Secondary | ICD-10-CM | POA: Diagnosis not present

## 2018-12-12 DIAGNOSIS — E782 Mixed hyperlipidemia: Secondary | ICD-10-CM | POA: Diagnosis not present

## 2018-12-12 DIAGNOSIS — J302 Other seasonal allergic rhinitis: Secondary | ICD-10-CM | POA: Diagnosis not present

## 2018-12-12 DIAGNOSIS — K219 Gastro-esophageal reflux disease without esophagitis: Secondary | ICD-10-CM | POA: Diagnosis not present

## 2018-12-12 DIAGNOSIS — E1165 Type 2 diabetes mellitus with hyperglycemia: Secondary | ICD-10-CM | POA: Diagnosis not present

## 2018-12-12 DIAGNOSIS — I10 Essential (primary) hypertension: Secondary | ICD-10-CM | POA: Diagnosis not present

## 2019-01-02 DIAGNOSIS — K219 Gastro-esophageal reflux disease without esophagitis: Secondary | ICD-10-CM | POA: Diagnosis not present

## 2019-01-02 DIAGNOSIS — E782 Mixed hyperlipidemia: Secondary | ICD-10-CM | POA: Diagnosis not present

## 2019-01-02 DIAGNOSIS — I10 Essential (primary) hypertension: Secondary | ICD-10-CM | POA: Diagnosis not present

## 2019-01-02 DIAGNOSIS — E1165 Type 2 diabetes mellitus with hyperglycemia: Secondary | ICD-10-CM | POA: Diagnosis not present

## 2019-01-02 DIAGNOSIS — J302 Other seasonal allergic rhinitis: Secondary | ICD-10-CM | POA: Diagnosis not present

## 2019-02-03 DIAGNOSIS — I10 Essential (primary) hypertension: Secondary | ICD-10-CM | POA: Diagnosis not present

## 2019-02-03 DIAGNOSIS — K219 Gastro-esophageal reflux disease without esophagitis: Secondary | ICD-10-CM | POA: Diagnosis not present

## 2019-02-03 DIAGNOSIS — J302 Other seasonal allergic rhinitis: Secondary | ICD-10-CM | POA: Diagnosis not present

## 2019-02-03 DIAGNOSIS — E1165 Type 2 diabetes mellitus with hyperglycemia: Secondary | ICD-10-CM | POA: Diagnosis not present

## 2019-02-03 DIAGNOSIS — E782 Mixed hyperlipidemia: Secondary | ICD-10-CM | POA: Diagnosis not present

## 2019-07-03 DIAGNOSIS — Z23 Encounter for immunization: Secondary | ICD-10-CM | POA: Diagnosis not present

## 2019-07-03 DIAGNOSIS — E1165 Type 2 diabetes mellitus with hyperglycemia: Secondary | ICD-10-CM | POA: Diagnosis not present

## 2019-07-03 DIAGNOSIS — K219 Gastro-esophageal reflux disease without esophagitis: Secondary | ICD-10-CM | POA: Diagnosis not present

## 2019-07-03 DIAGNOSIS — I10 Essential (primary) hypertension: Secondary | ICD-10-CM | POA: Diagnosis not present

## 2019-07-03 DIAGNOSIS — E782 Mixed hyperlipidemia: Secondary | ICD-10-CM | POA: Diagnosis not present

## 2019-07-03 DIAGNOSIS — Z0001 Encounter for general adult medical examination with abnormal findings: Secondary | ICD-10-CM | POA: Diagnosis not present

## 2019-07-25 ENCOUNTER — Ambulatory Visit: Payer: Medicare Other | Attending: Internal Medicine

## 2019-07-25 DIAGNOSIS — Z20822 Contact with and (suspected) exposure to covid-19: Secondary | ICD-10-CM

## 2019-07-26 ENCOUNTER — Telehealth: Payer: Self-pay

## 2019-07-26 LAB — NOVEL CORONAVIRUS, NAA: SARS-CoV-2, NAA: NOT DETECTED

## 2019-07-26 NOTE — Telephone Encounter (Signed)
Patient called and he was informed that his test 07/25/19 for COVID-19 was negative. He was not infected with the Novel Coronavirus.  He verbalized understanding of all instructions.

## 2019-07-30 ENCOUNTER — Ambulatory Visit: Payer: Medicare Other | Attending: Internal Medicine

## 2019-07-30 DIAGNOSIS — Z20822 Contact with and (suspected) exposure to covid-19: Secondary | ICD-10-CM

## 2019-07-31 ENCOUNTER — Telehealth: Payer: Self-pay | Admitting: General Practice

## 2019-07-31 LAB — NOVEL CORONAVIRUS, NAA: SARS-CoV-2, NAA: NOT DETECTED

## 2019-07-31 NOTE — Telephone Encounter (Signed)
Negative COVID results given. Patient results "NOT Detected." Caller expressed understanding. ° °

## 2019-12-22 ENCOUNTER — Ambulatory Visit: Payer: Medicare Other | Attending: Internal Medicine

## 2019-12-22 DIAGNOSIS — Z20822 Contact with and (suspected) exposure to covid-19: Secondary | ICD-10-CM

## 2019-12-23 LAB — NOVEL CORONAVIRUS, NAA: SARS-CoV-2, NAA: NOT DETECTED

## 2019-12-23 LAB — SARS-COV-2, NAA 2 DAY TAT

## 2020-01-03 DIAGNOSIS — Z0001 Encounter for general adult medical examination with abnormal findings: Secondary | ICD-10-CM | POA: Diagnosis not present

## 2020-01-03 DIAGNOSIS — E119 Type 2 diabetes mellitus without complications: Secondary | ICD-10-CM | POA: Diagnosis not present

## 2020-06-04 DIAGNOSIS — E119 Type 2 diabetes mellitus without complications: Secondary | ICD-10-CM | POA: Diagnosis not present

## 2024-01-12 ENCOUNTER — Emergency Department (HOSPITAL_COMMUNITY)

## 2024-01-12 ENCOUNTER — Observation Stay (HOSPITAL_COMMUNITY)
Admission: EM | Admit: 2024-01-12 | Discharge: 2024-01-13 | Disposition: A | Discharge status: Home / Self Care | Attending: Internal Medicine | Admitting: Internal Medicine

## 2024-01-12 ENCOUNTER — Encounter (HOSPITAL_COMMUNITY): Payer: Self-pay

## 2024-01-12 ENCOUNTER — Other Ambulatory Visit: Payer: Self-pay

## 2024-01-12 DIAGNOSIS — R296 Repeated falls: Secondary | ICD-10-CM | POA: Diagnosis not present

## 2024-01-12 DIAGNOSIS — Z7982 Long term (current) use of aspirin: Secondary | ICD-10-CM | POA: Diagnosis not present

## 2024-01-12 DIAGNOSIS — I639 Cerebral infarction, unspecified: Principal | ICD-10-CM | POA: Insufficient documentation

## 2024-01-12 DIAGNOSIS — Z8546 Personal history of malignant neoplasm of prostate: Secondary | ICD-10-CM | POA: Insufficient documentation

## 2024-01-12 DIAGNOSIS — Z7984 Long term (current) use of oral hypoglycemic drugs: Secondary | ICD-10-CM | POA: Insufficient documentation

## 2024-01-12 DIAGNOSIS — Z59819 Housing instability, housed unspecified: Secondary | ICD-10-CM | POA: Diagnosis not present

## 2024-01-12 DIAGNOSIS — M47896 Other spondylosis, lumbar region: Secondary | ICD-10-CM

## 2024-01-12 DIAGNOSIS — H539 Unspecified visual disturbance: Secondary | ICD-10-CM | POA: Insufficient documentation

## 2024-01-12 DIAGNOSIS — H538 Other visual disturbances: Secondary | ICD-10-CM | POA: Insufficient documentation

## 2024-01-12 DIAGNOSIS — R270 Ataxia, unspecified: Secondary | ICD-10-CM | POA: Diagnosis not present

## 2024-01-12 DIAGNOSIS — M51369 Other intervertebral disc degeneration, lumbar region without mention of lumbar back pain or lower extremity pain: Secondary | ICD-10-CM | POA: Diagnosis not present

## 2024-01-12 DIAGNOSIS — Z79899 Other long term (current) drug therapy: Secondary | ICD-10-CM | POA: Diagnosis not present

## 2024-01-12 DIAGNOSIS — I6381 Other cerebral infarction due to occlusion or stenosis of small artery: Secondary | ICD-10-CM | POA: Diagnosis not present

## 2024-01-12 DIAGNOSIS — M5416 Radiculopathy, lumbar region: Secondary | ICD-10-CM

## 2024-01-12 DIAGNOSIS — D519 Vitamin B12 deficiency anemia, unspecified: Secondary | ICD-10-CM | POA: Insufficient documentation

## 2024-01-12 DIAGNOSIS — Z87891 Personal history of nicotine dependence: Secondary | ICD-10-CM | POA: Insufficient documentation

## 2024-01-12 DIAGNOSIS — M47816 Spondylosis without myelopathy or radiculopathy, lumbar region: Secondary | ICD-10-CM

## 2024-01-12 DIAGNOSIS — W19XXXA Unspecified fall, initial encounter: Secondary | ICD-10-CM | POA: Insufficient documentation

## 2024-01-12 DIAGNOSIS — G629 Polyneuropathy, unspecified: Secondary | ICD-10-CM | POA: Diagnosis not present

## 2024-01-12 DIAGNOSIS — M5117 Intervertebral disc disorders with radiculopathy, lumbosacral region: Secondary | ICD-10-CM | POA: Insufficient documentation

## 2024-01-12 DIAGNOSIS — E538 Deficiency of other specified B group vitamins: Secondary | ICD-10-CM | POA: Diagnosis not present

## 2024-01-12 DIAGNOSIS — R29700 NIHSS score 0: Secondary | ICD-10-CM | POA: Diagnosis not present

## 2024-01-12 DIAGNOSIS — Z7902 Long term (current) use of antithrombotics/antiplatelets: Secondary | ICD-10-CM | POA: Diagnosis not present

## 2024-01-12 DIAGNOSIS — R300 Dysuria: Secondary | ICD-10-CM | POA: Insufficient documentation

## 2024-01-12 DIAGNOSIS — R42 Dizziness and giddiness: Secondary | ICD-10-CM | POA: Diagnosis present

## 2024-01-12 DIAGNOSIS — R2681 Unsteadiness on feet: Principal | ICD-10-CM

## 2024-01-12 DIAGNOSIS — R29810 Facial weakness: Secondary | ICD-10-CM | POA: Insufficient documentation

## 2024-01-12 DIAGNOSIS — R531 Weakness: Secondary | ICD-10-CM | POA: Diagnosis present

## 2024-01-12 DIAGNOSIS — E119 Type 2 diabetes mellitus without complications: Secondary | ICD-10-CM | POA: Insufficient documentation

## 2024-01-12 DIAGNOSIS — I10 Essential (primary) hypertension: Secondary | ICD-10-CM | POA: Diagnosis not present

## 2024-01-12 DIAGNOSIS — I6389 Other cerebral infarction: Principal | ICD-10-CM | POA: Insufficient documentation

## 2024-01-12 DIAGNOSIS — I63312 Cerebral infarction due to thrombosis of left middle cerebral artery: Secondary | ICD-10-CM

## 2024-01-12 LAB — HEMOGLOBIN A1C
Hgb A1c MFr Bld: 6.4 % — ABNORMAL HIGH (ref 4.8–5.6)
Mean Plasma Glucose: 136.98 mg/dL

## 2024-01-12 LAB — COMPREHENSIVE METABOLIC PANEL WITH GFR
ALT: 14 U/L (ref 0–44)
AST: 26 U/L (ref 15–41)
Albumin: 4 g/dL (ref 3.5–5.0)
Alkaline Phosphatase: 69 U/L (ref 38–126)
Anion gap: 12 (ref 5–15)
BUN: 16 mg/dL (ref 8–23)
CO2: 24 mmol/L (ref 22–32)
Calcium: 9.5 mg/dL (ref 8.9–10.3)
Chloride: 100 mmol/L (ref 98–111)
Creatinine, Ser: 1.09 mg/dL (ref 0.61–1.24)
GFR, Estimated: >60 mL/min (ref >60)
Glucose, Bld: 147 mg/dL — ABNORMAL HIGH (ref 70–99)
Potassium: 4.6 mmol/L (ref 3.5–5.1)
Sodium: 136 mmol/L (ref 135–145)
Total Bilirubin: 1.7 mg/dL — ABNORMAL HIGH (ref 0.0–1.2)
Total Protein: 8.3 g/dL — ABNORMAL HIGH (ref 6.5–8.1)

## 2024-01-12 LAB — URINALYSIS, W/ REFLEX TO CULTURE (INFECTION SUSPECTED)
Bacteria, UA: NONE SEEN
Bilirubin Urine: NEGATIVE
Glucose, UA: NEGATIVE mg/dL
Hgb urine dipstick: NEGATIVE
Ketones, ur: 5 mg/dL — AB
Leukocytes,Ua: NEGATIVE
Nitrite: NEGATIVE
Protein, ur: 30 mg/dL — AB
Specific Gravity, Urine: 1.026 (ref 1.005–1.030)
pH: 5 (ref 5.0–8.0)

## 2024-01-12 LAB — CBC
HCT: 40.9 % (ref 39.0–52.0)
Hemoglobin: 13.5 g/dL (ref 13.0–17.0)
MCH: 29.5 pg (ref 26.0–34.0)
MCHC: 33 g/dL (ref 30.0–36.0)
MCV: 89.3 fL (ref 80.0–100.0)
Platelets: 408 K/uL — ABNORMAL HIGH (ref 150–400)
RBC: 4.58 MIL/uL (ref 4.22–5.81)
RDW: 11.4 % — ABNORMAL LOW (ref 11.5–15.5)
WBC: 4.2 K/uL (ref 4.0–10.5)
nRBC: 0 % (ref 0.0–0.2)

## 2024-01-12 LAB — CBG MONITORING, ED: Glucose-Capillary: 277 mg/dL — ABNORMAL HIGH (ref 70–99)

## 2024-01-12 LAB — PROTIME-INR
INR: 1 (ref 0.8–1.2)
Prothrombin Time: 14.2 s (ref 11.4–15.2)

## 2024-01-12 LAB — TSH: TSH: 0.879 u[IU]/mL (ref 0.350–4.500)

## 2024-01-12 MED ORDER — ASPIRIN 81 MG PO TBEC: 81.0000 mg | DELAYED_RELEASE_TABLET | Freq: Every day | ORAL | Status: DC

## 2024-01-12 MED ORDER — GADOBUTROL 1 MMOL/ML IV SOLN
7.0000 mL | Freq: Once | INTRAVENOUS | Status: AC | PRN
  Administered 2024-01-12: 7 mL via INTRAVENOUS

## 2024-01-12 MED ORDER — SODIUM CHLORIDE 0.9 % IV BOLUS
1000.0000 mL | Freq: Once | INTRAVENOUS | Status: AC
  Administered 2024-01-12: 1000 mL via INTRAVENOUS

## 2024-01-12 MED ORDER — CLOPIDOGREL BISULFATE 75 MG PO TABS
75.0000 mg | ORAL_TABLET | Freq: Every day | ORAL | Status: DC
  Administered 2024-01-13: 75 mg via ORAL
  Filled 2024-01-12: qty 1

## 2024-01-12 MED ORDER — IOHEXOL 350 MG/ML SOLN
75.0000 mL | Freq: Once | INTRAVENOUS | Status: AC | PRN
  Administered 2024-01-12: 75 mL via INTRAVENOUS

## 2024-01-12 MED ORDER — INSULIN ASPART 100 UNIT/ML IJ SOLN
0.0000 [IU] | INTRAMUSCULAR | Status: DC
  Administered 2024-01-12: 5 [IU] via SUBCUTANEOUS
  Administered 2024-01-13: 3 [IU] via SUBCUTANEOUS

## 2024-01-12 NOTE — ED Triage Notes (Signed)
 Pt came in via POV d/t frequent falls, not a very good historian of how often he has been falling & how long this has been a problem. Just states he has a problem walking from feeling dizzy when he stands. Denies any current pain, A/Ox4.

## 2024-01-12 NOTE — ED Notes (Signed)
 CCMD called.

## 2024-01-12 NOTE — ED Provider Notes (Signed)
 [place] EMERGENCY DEPARTMENT AT [facility] Provider Note   CSN: [id number] Arrival date & time: 01/12/24  1147     Patient presents with: Fall and Dizziness   LAURENCE CROFFORD is a 63 y.o. male.   The history is provided by the patient and medical records. No language interpreter was used.  Fall This is a recurrent problem. The problem has not changed since onset.Pertinent negatives include no chest pain, no abdominal pain, no headaches and no shortness of breath. The symptoms are aggravated by walking. Nothing relieves the symptoms. He has tried nothing for the symptoms. The treatment provided no relief.  Dizziness Associated symptoms: weakness   Associated symptoms: no chest pain, no diarrhea, no headaches, no nausea, no palpitations, no shortness of breath and no vomiting        Prior to Admission medications   Medication Sig Start Date End Date Taking? Authorizing Provider  Dextromethorphan -guaiFENesin  (ROBITUSSIN SUGAR FREE) 10-100 MG/5ML liquid Take 5 mLs by mouth every 12 (twelve) hours. Patient not taking: Reported on 01/27/2018 09/15/17   Haviland, Julie, MD  metFORMIN (GLUCOPHAGE) 500 MG tablet Take 500 mg by mouth 2 (two) times daily with a meal.    [provider]  propranolol (INDERAL) 20 MG tablet Take 20 mg by mouth 2 (two) times daily. 01/04/18   [provider]    Allergies: Patient has no known allergies.    Review of Systems  Constitutional:  Negative for chills, diaphoresis, fatigue and fever.  HENT:  Negative for congestion.   Eyes:  Positive for visual disturbance.  Respiratory:  Negative for cough, chest tightness, shortness of breath and wheezing.   Cardiovascular:  Negative for chest pain and palpitations.  Gastrointestinal:  Negative for abdominal pain, constipation, diarrhea, nausea and vomiting.  Genitourinary:  Positive for dysuria. Negative for flank pain.  Musculoskeletal:  Negative for back pain and neck pain.  Skin:   Negative for rash and wound.  Neurological:  Positive for dizziness and weakness. Negative for syncope, speech difficulty, light-headedness, numbness and headaches.  Psychiatric/Behavioral:  Negative for agitation and confusion.   All other systems reviewed and are negative.   Updated Vital Signs BP (!) 146/100 (BP Location: Right Arm)   Pulse (!) 104   Temp 98.4 F (36.9 C)   Resp 19   Ht 6' (1.829 m)   Wt 68 kg   SpO2 96%   BMI 20.34 kg/m   Physical Exam Vitals and nursing note reviewed.  Constitutional:      General: He is not in acute distress.    Appearance: He is well-developed. He is not ill-appearing, toxic-appearing or diaphoretic.  HENT:     Head: Normocephalic and atraumatic.     Nose: No congestion or rhinorrhea.     Mouth/Throat:     Mouth: Mucous membranes are moist.     Pharynx: No oropharyngeal exudate or posterior oropharyngeal erythema.  Eyes:     Extraocular Movements: Extraocular movements intact.     Conjunctiva/sclera: Conjunctivae normal.     Pupils: Pupils are equal, round, and reactive to light.  Neck:     Vascular: No carotid bruit.  Cardiovascular:     Rate and Rhythm: Normal rate and regular rhythm.     Pulses: Normal pulses.     Heart sounds: No murmur heard. Pulmonary:     Effort: Pulmonary effort is normal. No respiratory distress.     Breath sounds: Normal breath sounds. No wheezing, rhonchi or rales.  Chest:  Chest wall: No tenderness.  Abdominal:     General: Abdomen is flat.     Palpations: Abdomen is soft.     Tenderness: There is no abdominal tenderness. There is no right CVA tenderness, left CVA tenderness, guarding or rebound.  Musculoskeletal:        General: No swelling or tenderness.     Cervical back: Neck supple. No tenderness.     Right lower leg: No edema.     Left lower leg: No edema.  Skin:    General: Skin is warm and dry.     Capillary Refill: Capillary refill takes less than 2 seconds.     Findings: No  erythema or rash.  Neurological:     Mental Status: He is alert.     Sensory: No sensory deficit.     Motor: Weakness present.     Comments: Subtle left facial droop at rest.  Goes away when smiling.  Mild weakness to left grip and left leg compared to right.  Intact sensation.  No visual field deficits on initial exam.  Psychiatric:        Mood and Affect: Mood normal.     (all labs ordered are listed, but only abnormal results are displayed) Labs Reviewed  COMPREHENSIVE METABOLIC PANEL WITH GFR - Abnormal; Notable for the following components:      Result Value   Glucose, Bld 147 (*)    Total Protein 8.3 (*)    Total Bilirubin 1.7 (*)    All other components within normal limits  CBC - Abnormal; Notable for the following components:   RDW 11.4 (*)    Platelets 408 (*)    All other components within normal limits  URINALYSIS, W/ REFLEX TO CULTURE (INFECTION SUSPECTED) - Abnormal; Notable for the following components:   Ketones, ur 5 (*)    Protein, ur 30 (*)    All other components within normal limits  TSH  PROTIME-INR    EKG: EKG Interpretation Date/Time:  Saturday January 12 2024 12:52:14 EDT Ventricular Rate:  85 PR Interval:  158 QRS Duration:  81 QT Interval:  361 QTC Calculation: 430 R Axis:   -10  Text Interpretation: Sinus rhythm Consider left atrial enlargement Abnormal R-wave progression, early transition Left ventricular hypertrophy when compared to prior, similar appearance No STEMI Confirmed by Ginger Barefoot ([id number]) on 01/12/2024 12:54:21 PM  Radiology: CT ANGIO HEAD NECK W WO CM Result Date: 01/12/2024 CLINICAL DATA:  Provided history: Neuro deficit, acute, stroke suspected. EXAM: CT ANGIOGRAPHY HEAD AND NECK WITH AND WITHOUT CONTRAST TECHNIQUE: Multidetector CT imaging of the head and neck was performed using the standard protocol during bolus administration of intravenous contrast. Multiplanar CT image reconstructions and MIPs were obtained to evaluate the  vascular anatomy. Carotid stenosis measurements (when applicable) are obtained utilizing NASCET criteria, using the distal internal carotid diameter as the denominator. RADIATION DOSE REDUCTION: This exam was performed according to the departmental dose-optimization program which includes automated exposure control, adjustment of the mA and/or kV according to patient size and/or use of iterative reconstruction technique. CONTRAST:  75mL OMNIPAQUE  IOHEXOL  350 MG/ML SOLN COMPARISON:  Brain MRI 05/14/2015. FINDINGS: CT HEAD FINDINGS Brain: Mild-to-moderate prominence of the lateral third ventricles, slightly progressed from the prior MRI of 05/14/2015. This is at least partly due to cerebral atrophy. However, there also imaging features which can be seen in the setting of normal pressure hydrocephalus (crowding of the sulci at the level of the high frontoparietal lobes, an acute callosal  angle and prominence of the sylvian fissures), and a component of NPH is possible in the appropriate clinical setting. Patchy and ill-defined hypoattenuation within the cerebral white matter, nonspecific but compatible with mild-to-moderate chronic small vessel ischemic disease. There is no acute intracranial hemorrhage. No demarcated cortical infarct. No extra-axial fluid collection. No evidence of an intracranial mass. No midline shift. Vascular: No hyperdense vessel. Skull: No mass or acute finding within the imaged orbits. Sinuses/Orbits: No orbital mass or acute orbital finding. Mucous retention cysts within the left maxillary sinus measuring up to 17 mm. Review of the MIP images confirms the above findings CTA NECK FINDINGS Aortic arch: Standard aortic branching. Atherosclerotic plaque within the visualized aortic arch. Streak/beam hardening artifact arising from a dense contrast bolus partially obscures the left subclavian artery. Within this limitation, there is no appreciable hemodynamically significant innominate or proximal  subclavian artery stenosis. Right carotid system: CCA and ICA patent within the neck without stenosis or significant atherosclerotic disease. Tortuosity of the cervical ICA. Left carotid system: Streak/beam hardening artifact partially obscures the proximal common carotid artery. Within this limitation, the common carotid and internal carotid arteries are patent within the neck without hemodynamically significant stenosis (50% or greater). Minimal nonstenotic atherosclerotic plaque within the CCA. Mild-to-moderate atherosclerotic plaque within the proximal ICA. Vertebral arteries: Venous reflux of contrast limits evaluation of the left vertebral artery origin. Within this limitation, the vertebral arteries are codominant and patent within the neck without stenosis or significant atherosclerotic disease. Skeleton: Levocurvature of the cervical spine. Dextrocurvature of the upper thoracic spine, partially imaged. Cervical spondylosis. No acute fracture or aggressive osseous lesion. Other neck: No neck mass or cervical lymphadenopathy. Upper chest: No consolidation within the imaged lung apices. Review of the MIP images confirms the above findings CTA HEAD FINDINGS Anterior circulation: The intracranial internal carotid arteries are patent. The M1 middle cerebral arteries are patent. No M2 proximal branch occlusion or high-grade proximal stenosis. The anterior cerebral arteries are patent. Mildly hypoplastic left A1 segment. No intracranial aneurysm is identified. Posterior circulation: The intracranial vertebral arteries are patent. The basilar artery is patent. The posterior cerebral arteries are patent. Atherosclerotic irregularity of both vessels without high-grade proximal stenosis. Hypoplastic left P1 segment with sizable left posterior communicating artery. A right posterior communicating artery is also present. Venous sinuses: Evaluation for no venous sinus thrombosis is limited due to contrast timing. Anatomic  variants: As described. Review of the MIP images confirms the above findings IMPRESSION: Non-contrast head CT: 1.  No evidence of an acute intracranial abnormality. 2. Mild-to-moderate cerebral white matter chronic small vessel ischemic disease. 3. Mild-to-moderate prominence of the lateral third ventricles, slightly progressed from the prior MRI of 05/14/2015. This is at least partly due to cerebral atrophy. However, there also imaging features which can be seen in the setting of normal pressure hydrocephalus, and a component of NPH is possible in the appropriate clinical setting. 4. Mucous retention cysts within the left maxillary sinus (measuring up to 17 mm). CTA neck: 1. Artifact partially obscures the proximal left common carotid artery and obscures the left vertebral artery origin. Within this limitation, findings are as follows. 2. The common carotid and internal carotid arteries are patent within the neck without hemodynamically significant stenosis. Atherosclerotic plaque bilaterally (left greater than right), as described. 3. Vertebral arteries patent within the neck without stenosis or significant disc disease. 4. Aortic Atherosclerosis (ICD10-I70.0). 5. Cervical spondylosis. CTA head: No proximal intracranial large vessel occlusion or high-grade proximal arterial stenosis identified. Electronically Signed  By: Rockey Childs D.O.   On: 01/12/2024 19:01     Procedures   Medications Ordered in the ED  sodium chloride  0.9 % bolus 1,000 mL (0 mLs Intravenous Stopped 01/12/24 1630)  iohexol  (OMNIPAQUE ) 350 MG/ML injection 75 mL (75 mLs Intravenous Contrast Given 01/12/24 1517)  gadobutrol  (GADAVIST ) 1 MMOL/ML injection 7 mL (7 mLs Intravenous Contrast Given 01/12/24 2023)                                    Medical Decision Making Amount and/or Complexity of Data Reviewed Labs: ordered. Radiology: ordered.  Risk Prescription drug management.    Hosteen S Schwall is a 63 y.o. male with a past  medical history significant for prostate cancer, hypertension, and previous vertigo who presents with unsteadiness and dizziness and weakness.  According to patient, he was treated for prostate cancer previously and has been off workup for the last few years.  He reports he has seen some doctors over there but reports he did not trust them so has not had much care.  He reports that [place] and since then has had multiple falls.  He reports his left side feels weak in his left arm and left leg and he feels unsteady and dizzy and everything is spinning at times.  He also reports some blurry vision mildly that comes and goes but denies speech changes.  He otherwise denies fevers, chills, congestion, cough, nausea, vomiting, constipation, or diarrhea.  He does report some mild dysuria.  Denies any headache or traumatic pains.  Denies pain in extremities.  On exam, lungs clear.  Chest nontender.  Abdomen nontender.  Patient said his speech is normal and unchanged from prior.  Pupils are symmetric and reactive with normal extraocular movements.  At rest patient does have a subtle left facial droop but when he smiles it is absent.  He did have very slight weakness in left arm and left leg compared to right.  Intact sensation.  Normal finger-nose-finger testing on my exam.  I do not appreciate a carotid bruit.  He could lift leg symmetrically.  Given the patient's subtle left-sided weakness symptoms and his report of intermittent weakness, his reported intermittent vision changes, and his unsteadiness with falls, he will need imaging.  With his history of prostate cancer and suggest an MRI without contrast, will get MRI with and without contrast to look for not only stroke but also rule out some brain metastasis or tumor.  Will get CTA as well given the potentially posterior circulation symptoms with vision and dizziness.  Will give him some fluids and get some labs.  Will get  urinalysis given his dysuria.  Anticipate reassessment after workup to determine disposition.     8:06 PM CT scan returned and did not show evidence of acute stroke.  It did show slightly more prominence of the ventricles slightly worse than it was in 2016 that could be related to normal pressure hydrocephalus in the correct settings.  Neurology agreed with getting MRI with and without contrast which is still ordered.  He does not think this is the acute problem.  He says that if MRI is negative, patient is likely safe for discharge home and outpatient neurology follow-up which is reasonable.  Otherwise labs were reassuring thus far.  Anticipate follow-up on MRI results and discharge if it is reassuring.  9:00 PM Patient  resting comfortably eating a sandwich.  He is still waiting on the MRI results.  Care transferred to Dr. Randol to await MRI results.  If it is negative, anticipate discharge home with outpatient neurology follow-up.  Patient agrees with this plan.  Care transferred awaiting MRI.  Anticipate discharge if the no stroke is seen.     Final diagnoses:  Unsteadiness  Fall, initial encounter  Dizziness      Clinical Impression: 1. Unsteadiness   2. Fall, initial encounter   3. Dizziness     Disposition: Care transferred oncoming team to wait for MRI results.  Anticipate discharge if it is reassuring.  This note was prepared with assistance of Conservation officer, historic buildings. Occasional wrong-word or sound-a-like substitutions may have occurred due to the inherent limitations of voice recognition software.      Wellington Winegarden, Lonni PARAS, MD 01/12/24 2101

## 2024-01-12 NOTE — Consult Note (Signed)
 NEUROLOGY CONSULT NOTE   Date of service: January 12, 2024 Patient Name: Douglas Stewart MRN:  [id number] DOB:  [birth year] Chief Complaint:  falls, dizziness Requesting Provider: Randol Simmonds, MD  History of Present Illness  Douglas Stewart is a 63 y.o. male with hx of HTN, prostrate cancer, who presents with stumbling that has been going on for more than a year. Was in [place] for the last 5 years and just returned. Did not see a doctor there. Reports BL lower ext numb and weak and feels off balance. Has had multiple falls over last year. Left side he feels is weaker than right. Can tell when his bladder is full, no significant problems with urination. No bowel incontinence. No saddle anesthesia.  He had an MRI which demonstrated a punctate acute/early subacute stroke in the posterior left frontal operculum.  LKW: 1 year ago Modified rankin score: 0-Completely asymptomatic and back to baseline post- stroke IV Thrombolysis: not offered, no symptoms attributable to stroke. EVT: not offered, no symptoms attributable to stroke.  NIHSS components Score: Comment  1a Level of Conscious 0[x]  1[]  2[]  3[]      1b LOC Questions 0[x]  1[]  2[]       1c LOC Commands 0[x]  1[]  2[]       2 Best Gaze 0[x]  1[]  2[]       3 Visual 0[x]  1[]  2[]  3[]      4 Facial Palsy 0[x]  1[]  2[]  3[]      5a Motor Arm - left 0[x]  1[]  2[]  3[]  4[]  UN[]    5b Motor Arm - Right 0[x]  1[]  2[]  3[]  4[]  UN[]    6a Motor Leg - Left 0[x]  1[]  2[]  3[]  4[]  UN[]    6b Motor Leg - Right 0[x]  1[]  2[]  3[]  4[]  UN[]    7 Limb Ataxia 0[x]  1[]  2[]  UN[]      8 Sensory 0[x]  1[]  2[]  UN[]      9 Best Language 0[x]  1[]  2[]  3[]      10 Dysarthria 0[x]  1[]  2[]  UN[]      11 Extinct. and Inattention 0[x]  1[]  2[]       TOTAL: 0      ROS  Comprehensive ROS performed and pertinent positives documented in HPI   Past History   Past Medical History:  Diagnosis Date   Hypertension    Prostate cancer (HCC)     Past Surgical History:  Procedure Laterality Date    no surgical history      Family History: Family History  Problem Relation Age of Onset   Stroke Neg Hx     Social History  reports that he quit smoking about 24 years ago. He has never used smokeless tobacco. He reports that he does not drink alcohol and does not use drugs.  No Known Allergies  Medications  No current facility-administered medications for this encounter.  Current Outpatient Medications:    Dextromethorphan -guaiFENesin  (ROBITUSSIN SUGAR FREE) 10-100 MG/5ML liquid, Take 5 mLs by mouth every 12 (twelve) hours. (Patient not taking: Reported on 01/27/2018), Disp: 118 mL, Rfl: 0   metFORMIN (GLUCOPHAGE) 500 MG tablet, Take 500 mg by mouth 2 (two) times daily with a meal., Disp: , Rfl:    propranolol (INDERAL) 20 MG tablet, Take 20 mg by mouth 2 (two) times daily., Disp: , Rfl: 1  Vitals   Vitals:   01/12/24 1202 01/12/24 1600 01/12/24 1604 01/12/24 1931  BP: (!) 146/100 (!) 144/124  (!) 152/96  Pulse: (!) 104 81  79  Resp: 19 15  16   Temp: 98.4 F (36.9  C)  98.1 F (36.7 C) 98.4 F (36.9 C)  TempSrc:   Oral Oral  SpO2: 96% 100%  100%  Weight:      Height:        Body mass index is 20.34 kg/m.   Physical Exam   General: Laying comfortably in bed; in no acute distress.  HENT: Normal oropharynx and mucosa. Normal external appearance of ears and nose.  Neck: Supple, no pain or tenderness  CV: No JVD. No peripheral edema.  Pulmonary: Symmetric Chest rise. Normal respiratory effort.  Abdomen: Soft to touch, non-tender.  Ext: No cyanosis, edema, or deformity  Skin: No rash. Normal palpation of skin.   Musculoskeletal: Normal digits and nails by inspection. No clubbing.   Neurologic Examination  Mental status/Cognition: Alert, oriented to self, place, month and year, good attention.  Speech/language: Fluent, comprehension intact, object naming intact, repetition intact.  Cranial nerves:   CN II Pupils equal and reactive to light, no VF deficits    CN  III,IV,VI EOM intact, no gaze preference or deviation, no nystagmus    CN V normal sensation in V1, V2, and V3 segments bilaterally    CN VII no asymmetry, no nasolabial fold flattening    CN VIII normal hearing to speech    CN IX & X normal palatal elevation, no uvular deviation    CN XI 5/5 head turn and 5/5 shoulder shrug bilaterally    CN XII midline tongue protrusion    Motor:  Muscle bulk: poor, tone normal Mvmt Root Nerve  Muscle Right Left Comments  SA C5/6 Ax Deltoid 5 5   EF C5/6 Mc Biceps 5 5   EE C6/7/8 Rad Triceps 5 5   WF C6/7 Med FCR     WE C7/8 PIN ECU     F Ab C8/T1 U ADM/FDI 5 5   HF L1/2/3 Fem Illopsoas 4+ 4+   KE L2/3/4 Fem Quad 4+ 4+   DF L4/5 D Peron Tib Ant 4 4   PF S1/2 Tibial Grc/Sol 4 4    Reflexes:  Right Left Comments  Pectoralis      Biceps (C5/6) 2 2   Brachioradialis (C5/6) 2 2    Triceps (C6/7) 2 2    Patellar (L3/4) 2+ 2+    Achilles (S1) 0 0    Hoffman      Plantar withdraws withdraws   Jaw jerk     Sensation:  Light touch Decreased in BL legs in a stocking distribution.   Pin prick Decreased in BL legs, calfs, feet.   Temperature    Vibration Absent in BL toes.  Proprioception    Coordination/Complex Motor:  - Finger to Nose intact BL - Heel to shin intact BL - Rapid alternating movement are intact BL - Gait: deferred.  Labs/Imaging/Neurodiagnostic studies   CBC:  Recent Labs  Lab 01-16-24 1212  WBC 4.2  HGB 13.5  HCT 40.9  MCV 89.3  PLT 408*   Basic Metabolic Panel:  Lab Results  Component Value Date   NA 136 2024-01-16   K 4.6 01-16-24   CO2 24 01/16/24   GLUCOSE 147 (H) Jan 16, 2024   BUN 16 16-Jan-2024   CREATININE 1.09 01/16/24   CALCIUM  9.5 01-16-24   GFRNONAA >60 Jan 16, 2024   GFRAA >60 01/27/2018   Lipid Panel:  Lab Results  Component Value Date   LDLCALC 77 10/13/2008   HgbA1c: No results found for: HGBA1C Urine Drug Screen: No results found for: LABOPIA, COCAINSCRNUR, LABBENZ,  AMPHETMU, THCU, LABBARB  Alcohol Level No results found for: [facility] INR  Lab Results  Component Value Date   INR 1.0 01/12/2024   APTT No results found for: APTT AED levels: No results found for: PHENYTOIN, ZONISAMIDE, LAMOTRIGINE, LEVETIRACETA  CT Head without contrast(Personally reviewed): CTH was negative for a large hypodensity concerning for a large territory infarct or hyperdensity concerning for an ICH  CT angio Head and Neck with contrast(Personally reviewed): No LVO  MRI Brain(Personally reviewed): Punctate focus of acute/early subacute ischemia at the posterior left frontal operculum.   ASSESSMENT   MALEKE FERIA is a 63 y.o. male p/w 1 year of gradually progressive numbness and weakness in BL lower extremities. He was in [place], did not trust the doctors there. Came to the ED when he got here.  He endorses poor diet. Was eating whatever he could. 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  Noted to have an incidental punctate infarct and being admitted for stroke workup.  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 - Antithrombotic -aspirin  81 mg daily along with Plavix  75 mg daily for 21 days, followed by aspirin  80 mg daily alone. - Recommend DVT ppx - SBP goal -aim for gradual normotension. - Recommend Telemetry monitoring for arrythmia - Recommend bedside swallow screen prior to PO intake. 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 - B12, folate, TSH, thiamine , B6, ESR, CRP. - B12 levels are low.   Started on cyancobalamin. - outpatient EMG/NCS. - PT and OT. ______________________________________________________________________  Plan discussed with patient at the bedside and with Dr. Silvester with the hospitalist team in person.   Signed, Almeter Westhoff, MD [facility]

## 2024-01-12 NOTE — Subjective & Objective (Signed)
 Patient coming in complaining of frequent falls Denies any chest pain no nausea no vomiting no diarrhea.   Has past history of prostate cancer but has not been followed up for past few years He did endorse that his left arm and leg felt weak He feels occasionally like the room was spinning occasional blurry vision no speech issues no pain Found to have subtotal left facial droop and mild left side weakness CTA showed no CVA but slightly prominent ventricles MRI showed small acute early subacute ischemia in the left frontal area

## 2024-01-12 NOTE — ED Notes (Signed)
 Pt ambulated to bathroom

## 2024-01-12 NOTE — Assessment & Plan Note (Addendum)
 Somewhat unclear etiology Although MRI showing punctate CVA it does not necessarily explain patient's presentation Check vitamin levels Would benefit from PT OT evaluation

## 2024-01-12 NOTE — ED Provider Notes (Addendum)
 Patient was initially seen by Dr. Imelda.  Please see his note  Patient's MRI does show a small acute early subacute area of ischemia in the left frontal operculum.   I will consult the medical service for admission further workup.  Case discussed with Dr Silvester Randol Simmonds, MD 01/12/24 2111    Randol Simmonds, MD 01/12/24 2221

## 2024-01-12 NOTE — Assessment & Plan Note (Signed)
 Allow permissive htn ?

## 2024-01-12 NOTE — ED Notes (Signed)
 Patient transported to MRI

## 2024-01-12 NOTE — H&P (Signed)
 Douglas Stewart FMW:[id number] DOB: 03-Jan-1961 DOA: 01/12/2024     PCP: Lennard Lesta FALCON, MD   Outpatient Specialists: NONE Patient arrived to ER on 01/12/24 at 1147 Referred by Attending Randol Simmonds, MD   Patient coming from:    home Lives  With friends     Chief Complaint:  Chief Complaint  Patient presents with   Fall   Dizziness    HPI: Douglas Stewart is a 63 y.o. male with medical history significant of prostate cancer, HTN   DM2  Presented with  falls, left side weakness Patient coming in complaining of frequent falls Denies any chest pain no nausea no vomiting no diarrhea.   Has past history of prostate cancer but has not been followed up for past few years He did endorse that his left arm and leg felt weak He feels occasionally like the room was spinning occasional blurry vision no speech issues no pain Found to have subtotal left facial droop and mild left side weakness CTA showed no CVA but slightly prominent ventricles MRI showed small acute early subacute ischemia in the left frontal area     Denies significant ETOH intake   Does not smoke     Regarding pertinent Chronic problems:     Hyperlipidemia - not on statins  Lipid Panel     Component Value Date/Time   CHOL 156 10/13/2008 2107   TRIG 220 (H) 10/13/2008 2107   HDL 35 (L) 10/13/2008 2107   CHOLHDL 4.5 Ratio 10/13/2008 2107   VLDL 44 (H) 10/13/2008 2107   LDLCALC 77 10/13/2008 2107     HTN not on meds      DM 2 -   diet controlled    Cancer - prostate cancer sp ressection  While in ER:    MRi showed small cva Neurology is aware    Lab Orders         Comprehensive metabolic panel         CBC         TSH         Protime-INR         Urinalysis, w/ Reflex to Culture (Infection Suspected) -Urine, Clean Catch      CTA HEAD/neck   - no acute  Mild-to-moderate cerebral white matter chronic small vessel ischemic disease. Mild-to-moderate prominence of the lateral third ventricles,    MRI brain   Punctate focus of acute/early subacute ischemia at the posterior left frontal operculum. 2. Early confluent T2 hyperintense signal within the cerebral white matter, most commonly due to chronic small vessel disease.  CXR -  NON acute    Following Medications were ordered in ER: Medications  sodium chloride  0.9 % bolus 1,000 mL (0 mLs Intravenous Stopped 01/12/24 1630)  iohexol  (OMNIPAQUE ) 350 MG/ML injection 75 mL (75 mLs Intravenous Contrast Given 01/12/24 1517)  gadobutrol  (GADAVIST ) 1 MMOL/ML injection 7 mL (7 mLs Intravenous Contrast Given 01/12/24 2023)    _______________________________________________________ ER Provider Called:    Neurology    Dr. Nanette They Recommend admit to medicine    SEEN in ER     ED Triage Vitals  Encounter Vitals Group     BP 01/12/24 1202 (!) 146/100     Girls Systolic BP Percentile --      Girls Diastolic BP Percentile --      Boys Systolic BP Percentile --      Boys Diastolic BP Percentile --      Pulse Rate 01/12/24  1202 (!) 104     Resp 01/12/24 1202 19     Temp 01/12/24 1202 98.4 F (36.9 C)     Temp Source 01/12/24 1604 Oral     SpO2 01/12/24 1202 96 %     Weight 01/12/24 1159 150 lb (68 kg)     Height 01/12/24 1159 6' (1.829 m)     Head Circumference --      Peak Flow --      Pain Score 01/12/24 1159 0     Pain Loc --      Pain Education --      Exclude from Growth Chart --   UFJK(75)@     _________________________________________ Significant initial  Findings: Abnormal Labs Reviewed  COMPREHENSIVE METABOLIC PANEL WITH GFR - Abnormal; Notable for the following components:      Result Value   Glucose, Bld 147 (*)    Total Protein 8.3 (*)    Total Bilirubin 1.7 (*)    All other components within normal limits  CBC - Abnormal; Notable for the following components:   RDW 11.4 (*)    Platelets 408 (*)    All other components within normal limits  URINALYSIS, W/ REFLEX TO CULTURE (INFECTION SUSPECTED) -  Abnormal; Notable for the following components:   Ketones, ur 5 (*)    Protein, ur 30 (*)    All other components within normal limits      ECG: Ordered Personally reviewed and interpreted by me showing: HR : 85 Rhythm:Sinus rhythm Consider left atrial enlargement Abnormal R-wave progression, early transition Left ventricular hypertrophy QTC 430    The recent clinical data is shown below. Vitals:   01/12/24 1931 01/12/24 1945 01/12/24 2000 01/12/24 2145  BP: (!) 152/96 (!) 143/96 (!) 152/109 (!) 147/105  Pulse: 79 81 80 87  Resp: 16 18 18  (!) 28  Temp: 98.4 F (36.9 C)     TempSrc: Oral     SpO2: 100% 100% 100% 100%  Weight:      Height:        WBC     Component Value Date/Time   WBC 4.2 01/12/2024 1212   LYMPHSABS 1.3 10/13/2008 2107   MONOABS 0.3 10/13/2008 2107   EOSABS 0.2 10/13/2008 2107   BASOSABS 0.0 10/13/2008 2107           UA   no evidence of UTI    Urine analysis:    Component Value Date/Time   COLORURINE YELLOW 01/12/2024 1246   APPEARANCEUR CLEAR 01/12/2024 1246   LABSPEC 1.026 01/12/2024 1246   PHURINE 5.0 01/12/2024 1246   GLUCOSEU NEGATIVE 01/12/2024 1246   HGBUR NEGATIVE 01/12/2024 1246   BILIRUBINUR NEGATIVE 01/12/2024 1246   KETONESUR 5 (A) 01/12/2024 1246   PROTEINUR 30 (A) 01/12/2024 1246   NITRITE NEGATIVE 01/12/2024 1246   LEUKOCYTESUR NEGATIVE 01/12/2024 1246    Results for orders placed or performed in visit on 12/22/19  Novel Coronavirus, NAA ([facility])     Status: None   Collection Time: 12/22/19  9:07 AM   Specimen: Oropharyngeal(OP) collection in vial transport medium   Oropharyngea  Testing  Result Value Ref Range Status   SARS-CoV-2, NAA Not Detected Not Detected Final    Comment: This nucleic acid amplification test was developed and its performance characteristics determined by [employer]. Nucleic acid amplification tests include RT-PCR and TMA. This test has not been FDA cleared or approved. This test  has been authorized by FDA under an Emergency Use Authorization (EUA).  This test is only authorized for the duration of time the declaration that circumstances exist justifying the authorization of the emergency use of in vitro diagnostic tests for detection of SARS-CoV-2 virus and/or diagnosis of COVID-19 infection under section 564(b)(1) of the Act, 21 U.S.C. 639aaa-6(a) (1), unless the authorization is terminated or revoked sooner. When diagnostic testing is negative, the possibility of a false negative result should be considered in the context of a patient's recent exposures and the presence of clinical signs and symptoms consistent with COVID-19. An individual without symptoms of COVID-19 and who is not shedding SARS-CoV-2 virus wo uld expect to have a negative (not detected) result in this assay.   SARS-COV-2, NAA 2 DAY TAT     Status: None   Collection Time: 12/22/19  9:07 AM   Oropharyngea  Testing  Result Value Ref Range Status   SARS-CoV-2, NAA 2 DAY TAT Performed  Final      __________________________________________________________ Recent Labs  Lab 01/12/24 1212  NA 136  K 4.6  CO2 24  GLUCOSE 147*  BUN 16  CREATININE 1.09  CALCIUM  9.5    Cr    stable,    Lab Results  Component Value Date   CREATININE 1.09 01/12/2024   CREATININE 1.07 01/27/2018   CREATININE 0.94 05/03/2015    Recent Labs  Lab 01/12/24 1212  AST 26  ALT 14  ALKPHOS 69  BILITOT 1.7*  PROT 8.3*  ALBUMIN 4.0   Lab Results  Component Value Date   CALCIUM  9.5 01/12/2024        Plt: Lab Results  Component Value Date   PLT 408 (H) 01/12/2024       Recent Labs  Lab 01/12/24 1212  WBC 4.2  HGB 13.5  HCT 40.9  MCV 89.3  PLT 408*    HG/HCT  stable,     Component Value Date/Time   HGB 13.5 01/12/2024 1212   HCT 40.9 01/12/2024 1212   MCV 89.3 01/12/2024 1212    _______________________________________________ Hospitalist was called for admission for ataxia   The  following Work up has been ordered so far:  Orders Placed This Encounter  Procedures   MR Brain W and Wo Contrast   CT ANGIO HEAD NECK W WO CM   Comprehensive metabolic panel   CBC   TSH   Protime-INR   Urinalysis, w/ Reflex to Culture (Infection Suspected) -Urine, Clean Catch   Diet NPO time specified   ED Cardiac monitoring   Consult to neurology   Consult to hospitalist   ED EKG   EKG 12-Lead     OTHER Significant initial  Findings:  labs showing:     DM  labs:  HbA1C: No results for input(s): HGBA1C in the last 8760 hours.     CBG (last 3)  No results for input(s): GLUCAP in the last 72 hours.        Cultures: No results found for: SDES, SPECREQUEST, CULT, REPTSTATUS   Radiological Exams on Admission: MR Brain W and Wo Contrast Result Date: 01/12/2024 EXAM: MRI BRAIN WITH AND WITHOUT CONTRAST 01/12/2024 08:30:54 PM TECHNIQUE: Multiplanar multisequence MRI of the head/brain was performed with and without the administration of intravenous contrast. COMPARISON: 05/14/2015 CLINICAL HISTORY: Neuro deficit, acute, stroke suspected; History of prostate cancer, [place] medical care. Now left-sided weakness and unsteadiness with multiple falls. FINDINGS: BRAIN AND VENTRICLES: Punctate focus of acute/early subacute ischemia at the posterior left frontal operculum. Generalized  volume loss. Early confluent hyperintense T2-weighted signal within the cerebral white matter, most commonly due to chronic small vessel disease. No acute intracranial hemorrhage. No mass effect or midline shift. No hydrocephalus. The sella is unremarkable. Normal flow voids. No mass or abnormal enhancement. ORBITS: Ocular lens replacements. No acute abnormality. SINUSES: Left maxillary sinus retention cysts. No acute abnormality. BONES AND SOFT TISSUES: Normal bone marrow signal and enhancement. No acute soft tissue abnormality. IMPRESSION: 1. Punctate  focus of acute/early subacute ischemia at the posterior left frontal operculum. 2. Early confluent T2 hyperintense signal within the cerebral white matter, most commonly due to chronic small vessel disease. 3. Generalized volume loss. Electronically signed by: Franky Stanford MD 01/12/2024 09:06 PM EDT RP Workstation: [id number]   CT ANGIO HEAD NECK W WO CM Result Date: 01/12/2024 CLINICAL DATA:  Provided history: Neuro deficit, acute, stroke suspected. EXAM: CT ANGIOGRAPHY HEAD AND NECK WITH AND WITHOUT CONTRAST TECHNIQUE: Multidetector CT imaging of the head and neck was performed using the standard protocol during bolus administration of intravenous contrast. Multiplanar CT image reconstructions and MIPs were obtained to evaluate the vascular anatomy. Carotid stenosis measurements (when applicable) are obtained utilizing NASCET criteria, using the distal internal carotid diameter as the denominator. RADIATION DOSE REDUCTION: This exam was performed according to the departmental dose-optimization program which includes automated exposure control, adjustment of the mA and/or kV according to patient size and/or use of iterative reconstruction technique. CONTRAST:  75mL OMNIPAQUE  IOHEXOL  350 MG/ML SOLN COMPARISON:  Brain MRI 05/14/2015. FINDINGS: CT HEAD FINDINGS Brain: Mild-to-moderate prominence of the lateral third ventricles, slightly progressed from the prior MRI of 05/14/2015. This is at least partly due to cerebral atrophy. However, there also imaging features which can be seen in the setting of normal pressure hydrocephalus (crowding of the sulci at the level of the high frontoparietal lobes, an acute callosal angle and prominence of the sylvian fissures), and a component of NPH is possible in the appropriate clinical setting. Patchy and ill-defined hypoattenuation within the cerebral white matter, nonspecific but compatible with mild-to-moderate chronic small vessel ischemic disease. There is no acute  intracranial hemorrhage. No demarcated cortical infarct. No extra-axial fluid collection. No evidence of an intracranial mass. No midline shift. Vascular: No hyperdense vessel. Skull: No mass or acute finding within the imaged orbits. Sinuses/Orbits: No orbital mass or acute orbital finding. Mucous retention cysts within the left maxillary sinus measuring up to 17 mm. Review of the MIP images confirms the above findings CTA NECK FINDINGS Aortic arch: Standard aortic branching. Atherosclerotic plaque within the visualized aortic arch. Streak/beam hardening artifact arising from a dense contrast bolus partially obscures the left subclavian artery. Within this limitation, there is no appreciable hemodynamically significant innominate or proximal subclavian artery stenosis. Right carotid system: CCA and ICA patent within the neck without stenosis or significant atherosclerotic disease. Tortuosity of the cervical ICA. Left carotid system: Streak/beam hardening artifact partially obscures the proximal common carotid artery. Within this limitation, the common carotid and internal carotid arteries are patent within the neck without hemodynamically significant stenosis (50% or greater). Minimal nonstenotic atherosclerotic plaque within the CCA. Mild-to-moderate atherosclerotic plaque within the proximal ICA. Vertebral arteries: Venous reflux of contrast limits evaluation of the left vertebral artery origin. Within this limitation, the vertebral arteries are codominant and patent within the neck without stenosis or significant atherosclerotic disease. Skeleton: Levocurvature of the cervical spine. Dextrocurvature of the upper thoracic spine, partially imaged. Cervical spondylosis. No acute fracture or aggressive osseous lesion. Other neck: No neck mass  or cervical lymphadenopathy. Upper chest: No consolidation within the imaged lung apices. Review of the MIP images confirms the above findings CTA HEAD FINDINGS Anterior  circulation: The intracranial internal carotid arteries are patent. The M1 middle cerebral arteries are patent. No M2 proximal branch occlusion or high-grade proximal stenosis. The anterior cerebral arteries are patent. Mildly hypoplastic left A1 segment. No intracranial aneurysm is identified. Posterior circulation: The intracranial vertebral arteries are patent. The basilar artery is patent. The posterior cerebral arteries are patent. Atherosclerotic irregularity of both vessels without high-grade proximal stenosis. Hypoplastic left P1 segment with sizable left posterior communicating artery. A right posterior communicating artery is also present. Venous sinuses: Evaluation for no venous sinus thrombosis is limited due to contrast timing. Anatomic variants: As described. Review of the MIP images confirms the above findings IMPRESSION: Non-contrast head CT: 1.  No evidence of an acute intracranial abnormality. 2. Mild-to-moderate cerebral white matter chronic small vessel ischemic disease. 3. Mild-to-moderate prominence of the lateral third ventricles, slightly progressed from the prior MRI of 05/14/2015. This is at least partly due to cerebral atrophy. However, there also imaging features which can be seen in the setting of normal pressure hydrocephalus, and a component of NPH is possible in the appropriate clinical setting. 4. Mucous retention cysts within the left maxillary sinus (measuring up to 17 mm). CTA neck: 1. Artifact partially obscures the proximal left common carotid artery and obscures the left vertebral artery origin. Within this limitation, findings are as follows. 2. The common carotid and internal carotid arteries are patent within the neck without hemodynamically significant stenosis. Atherosclerotic plaque bilaterally (left greater than right), as described. 3. Vertebral arteries patent within the neck without stenosis or significant disc disease. 4. Aortic Atherosclerosis (ICD10-I70.0). 5.  Cervical spondylosis. CTA head: No proximal intracranial large vessel occlusion or high-grade proximal arterial stenosis identified. Electronically Signed   By: Rockey Childs D.O.   On: 01/12/2024 19:01   _______________________________________________________________________________________________________ Latest  Blood pressure (!) 147/105, pulse 87, temperature 98.4 F (36.9 C), temperature source Oral, resp. rate (!) 28, height 6' (1.829 m), weight 68 kg, SpO2 100%.   Vitals  labs and radiology finding personally reviewed  Review of Systems:    Pertinent positives include:   gait abnormality Constitutional:  No weight loss, night sweats, Fevers, chills, fatigue, weight loss  HEENT:  No headaches, Difficulty swallowing,Tooth/dental problems,Sore throat,  No sneezing, itching, ear ache, nasal congestion, post nasal drip,  Cardio-vascular:  No chest pain, Orthopnea, PND, anasarca, dizziness, palpitations.no Bilateral lower extremity swelling  GI:  No heartburn, indigestion, abdominal pain, nausea, vomiting, diarrhea, change in bowel habits, loss of appetite, melena, blood in stool, hematemesis Resp:  no shortness of breath at rest. No dyspnea on exertion, No excess mucus, no productive cough, No non-productive cough, No coughing up of blood.No change in color of mucus.No wheezing. Skin:  no rash or lesions. No jaundice GU:  no dysuria, change in color of urine, no urgency or frequency. No straining to urinate.  No flank pain.  Musculoskeletal:  No joint pain or no joint swelling. No decreased range of motion. No back pain.  Psych:  No change in mood or affect. No depression or anxiety. No memory loss.  Neuro: no localizing neurological complaints, no tingling, no weakness, no double vision, no , no slurred speech, no confusion  All systems reviewed and apart from HOPI all are  negative _______________________________________________________________________________________________ Past Medical History:   Past Medical History:  Diagnosis Date   Hypertension  Prostate cancer [facility])       Past Surgical History:  Procedure Laterality Date   no surgical history      Social History:  Ambulatory   independently  but with difficulty     reports that he quit smoking about 24 years ago. He has never used smokeless tobacco. He reports that he does not drink alcohol and does not use drugs.     Family History:   Family History  Problem Relation Age of Onset   Stroke Neg Hx    ______________________________________________________________________________________________ Allergies: No Known Allergies   Prior to Admission medications   Medication Sig Start Date End Date Taking? Authorizing Provider  Dextromethorphan -guaiFENesin  (ROBITUSSIN SUGAR FREE) 10-100 MG/5ML liquid Take 5 mLs by mouth every 12 (twelve) hours. Patient not taking: Reported on 01/27/2018 09/15/17   Haviland, Julie, MD  metFORMIN (GLUCOPHAGE) 500 MG tablet Take 500 mg by mouth 2 (two) times daily with a meal.    [provider]  propranolol (INDERAL) 20 MG tablet Take 20 mg by mouth 2 (two) times daily. 01/04/18   [provider]    ___________________________________________________________________________________________________ Physical Exam:    01/12/2024    9:45 PM 01/12/2024    8:00 PM 01/12/2024    7:45 PM  Vitals with BMI  Systolic 147 152 856  Diastolic 105 109 96  Pulse 87 80 81     1. General:  in No  Acute distress   Chronically ill   -appearing 2. Psychological: Alert and   Oriented 3. Head/ENT:  Dry Mucous Membranes                          Head Non traumatic, neck supple                        Poor Dentition 4. SKIN:  decreased Skin turgor,  Skin clean Dry and intact no rash    5. Heart: Regular rate and rhythm no  Murmur, no Rub or gallop 6.  Lungs:   no wheezes or crackles   7. Abdomen: Soft,  non-tender, Non distended bowel sounds present 8. Lower extremities: no clubbing, cyanosis, no  edema 9. Neurologically left side slightly weaker 10. MSK: Normal range of motion    Chart has been reviewed  ______________________________________________________________________________________________  Assessment/Plan 63 y.o. male with medical history significant of prostate cancer, HTN   DM2   Admitted for ataxia, CVA    Present on Admission:  Stroke (HCC)  Hypertension     Ataxia Somewhat unclear etiology Although MRI showing punctate CVA it does not necessarily explain patient's presentation Check vitamin levels Would benefit from PT OT evaluation  Stroke [facility])  - will admit based on TIA/CVA protocol,        Monitor on Tele        MRI  showing acute ischemic CVA        CTA no LVO       Echo to evaluate for possible embolic source,        obtain cardiac enzymes,  ECG,   Lipid panel, TSH.        Order PT/OT evaluation.        keep nothing by mouth until passes swallow eval         Will make sure patient is on antiplatelet ASA 81 Plavix  agent and statin if LDL >70       Allow permissive Hypertension keep BP <220/120  Neurology consulted will see pt in ER    DM2 (diabetes mellitus, type 2) (HCC)  - Order Sensitive  SSI    -  check TSH and HgA1C  - Hold by mouth medications    Hypertension Allow per missive htn    Other plan as per orders.  DVT prophylaxis:  SCD      Code Status:    Code Status: Not on file FULL CODE  as per patient  I had personally discussed CODE STATUS with patient   ACP   none     Family Communication:   Family not at  Bedside    Diet  Diet Orders (From admission, onward)     Start     Ordered   01/12/24 1204  Diet NPO time specified  Diet effective now        01/12/24 1203            Disposition Plan:       To home once workup is complete and patient is stable    Following barriers for discharge:                              Stroke work up is complete                                                       Will need consultants to evaluate patient prior to discharge                    Consult Orders  (From admission, onward)           Start     Ordered   01/12/24 2121  Consult to hospitalist  Once       Provider:  (Not yet assigned)  Question Answer Comment  Place call to: [facility]   Reason for Consult Admit      01/12/24 2120                               Would benefit from PT/OT eval prior to DC  Ordered                                     Consults called: Neurology    Admission status:  ED Disposition     ED Disposition  Admit   Condition  --   Comment  Hospital Area: [facility] [[id number]]  Level of Care: Telemetry Medical [[id number]]  I expect the patient will be discharged within 24 hours: No (not a candidate for [facility]-2W observation unit)  May place patient in observation at [facility] or [facility] if equivalent level of care is available:: No  Covid Evaluation: Asymptomatic - no recent exposure (last 10 days) testing not required  Diagnosis: Stroke [facility]) [[id number]]  Admitting Physician: Nyimah Shadduck [[id number]]  Attending Physician: Nilaya Bouie [[id number]]  For patients discharging to extended facilities (i.e. SNF, AL, group homes or LTAC) initiate:: Discharge to SNF/Facility Placement COVID-19 Lab Testing Protocol           Obs      Level of care  tele  indefinitely please discontinue once patient no longer qualifies COVID-19 Labs   Jahmya Onofrio 01/12/2024, 11:08 PM    [facility]     after 2 AM please page floor coverage   If 7AM-7PM, please contact the day team taking care of the patient using [email or web address]

## 2024-01-12 NOTE — ED Notes (Addendum)
CCMD notified

## 2024-01-12 NOTE — Assessment & Plan Note (Signed)
-   will admit based on TIA/CVA protocol,        Monitor on Tele        MRI  showing acute ischemic CVA        CTA no LVO       Echo to evaluate for possible embolic source,        obtain cardiac enzymes,  ECG,   Lipid panel, TSH.        Order PT/OT evaluation.        keep nothing by mouth until passes swallow eval         Will make sure patient is on antiplatelet ASA 81 Plavix  agent and statin if LDL >70       Allow permissive Hypertension keep BP <220/120        Neurology consulted will see pt in ER

## 2024-01-12 NOTE — Assessment & Plan Note (Signed)
 -  Order Sensitive  SSI     -  check TSH and HgA1C  - Hold by mouth medications*

## 2024-01-12 NOTE — Discharge Instructions (Signed)
 Please follow-up with outpatient neurology for your unsteadiness that you have had for a while.  The CT scan today did not show acute bleeding or acute surgical problem.  It did not show clear evidence of acute stroke.  Please rest and stay hydrated and follow-up with a primary doctor to get reestablished as well.  If any symptoms change or worsen acutely, please return to the nearest emergency department.

## 2024-01-12 NOTE — ED Notes (Signed)
 Pt provided with food and drink.

## 2024-01-13 ENCOUNTER — Observation Stay (HOSPITAL_BASED_OUTPATIENT_CLINIC_OR_DEPARTMENT_OTHER)

## 2024-01-13 ENCOUNTER — Observation Stay (HOSPITAL_COMMUNITY)

## 2024-01-13 DIAGNOSIS — M47896 Other spondylosis, lumbar region: Secondary | ICD-10-CM

## 2024-01-13 DIAGNOSIS — M47816 Spondylosis without myelopathy or radiculopathy, lumbar region: Secondary | ICD-10-CM

## 2024-01-13 DIAGNOSIS — I6350 Cerebral infarction due to unspecified occlusion or stenosis of unspecified cerebral artery: Secondary | ICD-10-CM

## 2024-01-13 DIAGNOSIS — M5416 Radiculopathy, lumbar region: Secondary | ICD-10-CM | POA: Diagnosis not present

## 2024-01-13 DIAGNOSIS — I6389 Other cerebral infarction: Secondary | ICD-10-CM | POA: Diagnosis not present

## 2024-01-13 DIAGNOSIS — I739 Peripheral vascular disease, unspecified: Secondary | ICD-10-CM

## 2024-01-13 DIAGNOSIS — I639 Cerebral infarction, unspecified: Secondary | ICD-10-CM | POA: Diagnosis not present

## 2024-01-13 DIAGNOSIS — E785 Hyperlipidemia, unspecified: Secondary | ICD-10-CM

## 2024-01-13 DIAGNOSIS — Z59819 Housing instability, housed unspecified: Secondary | ICD-10-CM

## 2024-01-13 DIAGNOSIS — R270 Ataxia, unspecified: Secondary | ICD-10-CM | POA: Diagnosis not present

## 2024-01-13 DIAGNOSIS — E119 Type 2 diabetes mellitus without complications: Secondary | ICD-10-CM | POA: Diagnosis not present

## 2024-01-13 DIAGNOSIS — E538 Deficiency of other specified B group vitamins: Secondary | ICD-10-CM | POA: Diagnosis not present

## 2024-01-13 LAB — CBG MONITORING, ED
Glucose-Capillary: 161 mg/dL — ABNORMAL HIGH (ref 70–99)
Glucose-Capillary: 101 mg/dL — ABNORMAL HIGH (ref 70–99)
Glucose-Capillary: 207 mg/dL — ABNORMAL HIGH (ref 70–99)
Glucose-Capillary: 114 mg/dL — ABNORMAL HIGH (ref 70–99)

## 2024-01-13 LAB — LIPID PANEL
Cholesterol: 182 mg/dL (ref 0–200)
HDL: 30 mg/dL — ABNORMAL LOW (ref >40)
LDL Cholesterol: 126 mg/dL — ABNORMAL HIGH (ref 0–99)
Total CHOL/HDL Ratio: 6.1 ratio
Triglycerides: 132 mg/dL (ref <150)
VLDL: 26 mg/dL (ref 0–40)

## 2024-01-13 LAB — C-REACTIVE PROTEIN: CRP: 1 mg/dL — ABNORMAL HIGH (ref <1.0)

## 2024-01-13 LAB — FOLATE: Folate: 10.5 ng/mL (ref >5.9)

## 2024-01-13 LAB — CBC
HCT: 40 % (ref 39.0–52.0)
Hemoglobin: 13.5 g/dL (ref 13.0–17.0)
MCH: 29.7 pg (ref 26.0–34.0)
MCHC: 33.8 g/dL (ref 30.0–36.0)
MCV: 88.1 fL (ref 80.0–100.0)
Platelets: 351 K/uL (ref 150–400)
RBC: 4.54 MIL/uL (ref 4.22–5.81)
RDW: 11.2 % — ABNORMAL LOW (ref 11.5–15.5)
WBC: 3.7 K/uL — ABNORMAL LOW (ref 4.0–10.5)
nRBC: 0 % (ref 0.0–0.2)

## 2024-01-13 LAB — ECHOCARDIOGRAM COMPLETE
AR max vel: 3.17 cm2
AV Peak grad: 3.1 mmHg
Ao pk vel: 0.88 m/s
Area-P 1/2: 2.76 cm2
Height: 72 in
S' Lateral: 2.5 cm
Single Plane A4C EF: 43.6 %
Weight: 2400 [oz_av]

## 2024-01-13 LAB — HIV ANTIBODY (ROUTINE TESTING W REFLEX): HIV Screen 4th Generation wRfx: NONREACTIVE

## 2024-01-13 LAB — COMPREHENSIVE METABOLIC PANEL WITH GFR
ALT: 12 U/L (ref 0–44)
AST: 26 U/L (ref 15–41)
Albumin: 3.7 g/dL (ref 3.5–5.0)
Alkaline Phosphatase: 65 U/L (ref 38–126)
Anion gap: 10 (ref 5–15)
BUN: 10 mg/dL (ref 8–23)
CO2: 24 mmol/L (ref 22–32)
Calcium: 8.9 mg/dL (ref 8.9–10.3)
Chloride: 100 mmol/L (ref 98–111)
Creatinine, Ser: 0.84 mg/dL (ref 0.61–1.24)
GFR, Estimated: >60 mL/min (ref >60)
Glucose, Bld: 183 mg/dL — ABNORMAL HIGH (ref 70–99)
Potassium: 3.9 mmol/L (ref 3.5–5.1)
Sodium: 134 mmol/L — ABNORMAL LOW (ref 135–145)
Total Bilirubin: 1.1 mg/dL (ref 0.0–1.2)
Total Protein: 7.4 g/dL (ref 6.5–8.1)

## 2024-01-13 LAB — T4, FREE: Free T4: 0.87 ng/dL (ref 0.61–1.12)

## 2024-01-13 LAB — IRON AND TIBC
Iron: 75 ug/dL (ref 45–182)
Saturation Ratios: 26 % (ref 17.9–39.5)
TIBC: 291 ug/dL (ref 250–450)
UIBC: 216 ug/dL

## 2024-01-13 LAB — PREALBUMIN: Prealbumin: 22 mg/dL (ref 18–38)

## 2024-01-13 LAB — SEDIMENTATION RATE: Sed Rate: 51 mm/h — ABNORMAL HIGH (ref 0–16)

## 2024-01-13 LAB — RETICULOCYTES
Immature Retic Fract: 8.7 % (ref 2.3–15.9)
RBC.: 4.11 MIL/uL — ABNORMAL LOW (ref 4.22–5.81)
Retic Count, Absolute: 78.5 K/uL (ref 19.0–186.0)
Retic Ct Pct: 1.9 % (ref 0.4–3.1)

## 2024-01-13 LAB — PHOSPHORUS: Phosphorus: 3.3 mg/dL (ref 2.5–4.6)

## 2024-01-13 LAB — VITAMIN B12: Vitamin B-12: 176 pg/mL — ABNORMAL LOW (ref 180–914)

## 2024-01-13 LAB — MAGNESIUM: Magnesium: 2.3 mg/dL (ref 1.7–2.4)

## 2024-01-13 LAB — ETHANOL: Alcohol, Ethyl (B): <15 mg/dL (ref <15)

## 2024-01-13 LAB — FERRITIN: Ferritin: 221 ng/mL (ref 24–336)

## 2024-01-13 LAB — CK: Total CK: 343 U/L (ref 49–397)

## 2024-01-13 MED ORDER — STROKE: EARLY STAGES OF RECOVERY BOOK
Freq: Once | Status: AC
  Filled 2024-01-13: qty 1

## 2024-01-13 MED ORDER — CYANOCOBALAMIN 1000 MCG PO TABS
1000.0000 ug | ORAL_TABLET | Freq: Every day | ORAL | 0 refills | Status: AC
  Filled 2024-01-13: qty 90, 90d supply, fill #0
  Filled 2024-01-28: qty 30, 30d supply, fill #0

## 2024-01-13 MED ORDER — LISINOPRIL 10 MG PO TABS
10.0000 mg | ORAL_TABLET | Freq: Every day | ORAL | Status: DC
  Administered 2024-01-13: 10 mg via ORAL
  Filled 2024-01-13: qty 1

## 2024-01-13 MED ORDER — ACETAMINOPHEN 325 MG PO TABS: 650.0000 mg | ORAL_TABLET | ORAL | Status: DC | PRN

## 2024-01-13 MED ORDER — ACETAMINOPHEN 650 MG RE SUPP: 650.0000 mg | RECTAL | Status: DC | PRN

## 2024-01-13 MED ORDER — ASPIRIN 81 MG PO TBEC
81.0000 mg | DELAYED_RELEASE_TABLET | Freq: Every day | ORAL | 0 refills | Status: AC
  Filled 2024-01-13 – 2024-01-28 (×2): qty 90, 90d supply, fill #0

## 2024-01-13 MED ORDER — INSULIN ASPART 100 UNIT/ML IJ SOLN: 0.0000 [IU] | Freq: Three times a day (TID) | INTRAMUSCULAR | Status: DC

## 2024-01-13 MED ORDER — ASPIRIN 325 MG PO TABS
325.0000 mg | ORAL_TABLET | Freq: Every day | ORAL | Status: DC
  Administered 2024-01-13: 325 mg via ORAL
  Filled 2024-01-13: qty 1

## 2024-01-13 MED ORDER — ASPIRIN 300 MG RE SUPP: 300.0000 mg | Freq: Every day | RECTAL | Status: DC

## 2024-01-13 MED ORDER — ACETAMINOPHEN 160 MG/5ML PO SOLN: 650.0000 mg | ORAL | Status: DC | PRN

## 2024-01-13 MED ORDER — SODIUM CHLORIDE 0.9 % IV SOLN: INTRAVENOUS | Status: DC

## 2024-01-13 MED ORDER — HEPARIN SODIUM (PORCINE) 5000 UNIT/ML IJ SOLN
5000.0000 [IU] | Freq: Three times a day (TID) | INTRAMUSCULAR | Status: DC
  Administered 2024-01-13: 5000 [IU] via SUBCUTANEOUS
  Filled 2024-01-13: qty 1

## 2024-01-13 MED ORDER — ATORVASTATIN CALCIUM 40 MG PO TABS
40.0000 mg | ORAL_TABLET | Freq: Every day | ORAL | Status: DC
  Administered 2024-01-13: 40 mg via ORAL
  Filled 2024-01-13: qty 1

## 2024-01-13 MED ORDER — ASPIRIN 81 MG PO TBEC: 81.0000 mg | DELAYED_RELEASE_TABLET | Freq: Every day | ORAL | Status: DC

## 2024-01-13 MED ORDER — CLOPIDOGREL BISULFATE 75 MG PO TABS
75.0000 mg | ORAL_TABLET | Freq: Every day | ORAL | 0 refills | Status: AC
  Filled 2024-01-13 – 2024-01-28 (×2): qty 21, 21d supply, fill #0

## 2024-01-13 MED ORDER — LISINOPRIL 10 MG PO TABS
10.0000 mg | ORAL_TABLET | Freq: Every day | ORAL | 0 refills | Status: DC
  Filled 2024-01-13 – 2024-01-28 (×2): qty 30, 30d supply, fill #0

## 2024-01-13 MED ORDER — VITAMIN B-12 1000 MCG PO TABS
1000.0000 ug | ORAL_TABLET | Freq: Every day | ORAL | Status: DC
  Administered 2024-01-13: 1000 ug via ORAL
  Filled 2024-01-13: qty 1

## 2024-01-13 MED ORDER — GADOBUTROL 1 MMOL/ML IV SOLN
7.0000 mL | Freq: Once | INTRAVENOUS | Status: AC | PRN
  Administered 2024-01-13: 7 mL via INTRAVENOUS

## 2024-01-13 MED ORDER — ATORVASTATIN CALCIUM 40 MG PO TABS
40.0000 mg | ORAL_TABLET | Freq: Every day | ORAL | 0 refills | Status: DC
  Filled 2024-01-13 – 2024-01-28 (×2): qty 30, 30d supply, fill #0

## 2024-01-13 NOTE — Subjective & Objective (Signed)
 Pt seen and examined. MRI brain showed small CVA. Echo negative for shunt. LVEF 40-45%. On ASA, plavix , lipitor. Seen by PT. Cleared for DC.

## 2024-01-13 NOTE — Assessment & Plan Note (Signed)
 01-13-2024 MRI shows DJD and nerve impingement. Ambulatory referral to neurosurgery.

## 2024-01-13 NOTE — Progress Notes (Signed)
 PROGRESS NOTE    GRYFFIN ALTICE  FMW:[id number] DOB: Jun 24, 1961 DOA: 01/12/2024 PCP: Lennard Lesta FALCON, MD  Subjective: Pt seen and examined. MRI brain showed small CVA. Echo negative for shunt. LVEF 40-45%. On ASA, plavix , lipitor. Seen by PT. Cleared for DC.   Hospital Course: HPI: YASMIN DIBELLO is a 63 y.o. male with medical history significant of prostate cancer, HTN   DM2   Presented with  falls, left side weakness Patient coming in complaining of frequent falls Denies any chest pain no nausea no vomiting no diarrhea.   Has past history of prostate cancer but has not been followed up for past few years He did endorse that his left arm and leg felt weak He feels occasionally like the room was spinning occasional blurry vision no speech issues no pain Found to have subtotal left facial droop and mild left side weakness CTA showed no CVA but slightly prominent ventricles MRI showed small acute early subacute ischemia in the left frontal area  Significant Events: Admitted 01/12/2024 for acute CVA   Admission Labs: WBC 4.2, HgB 13.5, plt 408 Na 136, K 4.6, CO2 of 24, BUN 16, scr 1.09, glu 147 T prot 8.3, alb 4.0, AST 26, ALT 14, alk phos 69. T. Bili 1.7 UA negative nitrite, negative LE. Ketones 5, protein 30. Otherwise negative A1c 6.4% TSH 0.879 Fe 75, TIBC 291, %sat 26 ESR 51, crp 1.0, ferritin 221 Folate 10.5 Vit B12 176  Admission Imaging Studies: CT head No evidence of an acute intracranial abnormality. 2. Mild-to-moderate cerebral white matter chronic small vessel ischemic disease. 3. Mild-to-moderate prominence of the lateral third ventricles, slightly progressed from the prior MRI of 05/14/2015. This is at least partly due to cerebral atrophy. However, there also imaging features which can be seen in the setting of normal pressure hydrocephalus, and a component of NPH is possible in the appropriate clinical setting. 4. Mucous retention cysts within the left maxillary  sinus (measuring up to 17 mm)  CTA neck Artifact partially obscures the proximal left common carotid artery and obscures the left vertebral artery origin. Within this limitation, findings are as follows. 2. The common carotid and internal carotid arteries are patent within the neck without hemodynamically significant stenosis. Atherosclerotic plaque bilaterally (left greater than right), as described. 3. Vertebral arteries patent within the neck without stenosis or significant disc disease. 4. Aortic Atherosclerosis (ICD10-I70.0). 5. Cervical spondylosis. CTA head No proximal intracranial large vessel occlusion or high-grade proximal arterial stenosis identified MRI brain Punctate focus of acute/early subacute ischemia at the posterior left frontal operculum. 2. Early confluent T2 hyperintense signal within the cerebral white matter, most commonly due to chronic small vessel disease. 3. Generalized volume loss CXR No acute process.   Significant Labs:   Significant Imaging Studies: MRI lumbar spine Chronic lumbar spine degeneration. Chronic but progressed and severe multifactorial spinal, lateral recess, and left foraminal stenosis at L4-L5. Query left L4 and/or bilateral L5 radiculitis. 2. Stable Moderate multifactorial spinal stenosis at L3-L4 since 2019. But moderate to severe Left foraminal stenosis there appears  progressed. Query left L3 radiculitis. 3. Stable mild spinal stenosis at L2-L3.  Echo  LVEF 40-45%, no atrial level shunt  Antibiotic Therapy: Anti-infectives (From admission, onward)    None       Procedures:   Consultants: neurology    Assessment and Plan: * Stroke (HCC) 01-13-2024 echo negative for shunt. LVEF 40-45%. On ASA, plavix  for 3 weeks, then asa alone. On lipitor 40 mg daily.  Add lisinopril  10 mg daily.  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  Housing instability 01-13-2024 pt [place] just [place] to [place]  after spending several years living in [place]. He does  not have a job and is living with some friends.  Degenerative joint disease (DJD) of lumbar spine 01-13-2024 MRI shows DJD and nerve impingement. Ambulatory referral to neurosurgery.  Lumbar radiculopathy 01-13-2024 MRI shows DJD and nerve impingement. Ambulatory referral to neurosurgery.  B12 deficiency 01-13-2024 start B12 1000 mcg daily.  Essential hypertension 01-13-2024 start lisinopril  10 mg daily.  DM2 (diabetes mellitus, type 2) (HCC) 01-13-2024 A1c 6.4%. he needs to f/u with PCP.   DVT prophylaxis: heparin  injection 5,000 Units Start: 01/13/24 1430 SCD's Start: 01/13/24 0453    Code Status: Full Code Family Communication: no family at bedside. Pt is decisional. Disposition Plan: home Reason for continuing need for hospitalization: stable for DC.  Objective: Vitals:   01/13/24 0741 01/13/24 1004 01/13/24 1153 01/13/24 1345  BP: (!) 120/93 (!) 168/103 (!) 156/91 (!) 146/96  Pulse: 85 92 81 91  Resp: 15 17 16 20   Temp: 97.6 F (36.4 C) 97.9 F (36.6 C) 98 F (36.7 C)   TempSrc: Oral Oral Oral   SpO2: 100% 100% 100% 100%  Weight:      Height:        Intake/Output Summary (Last 24 hours) at 01/13/2024 1416 Last data filed at 01/13/2024 1130 Gross per 24 hour  Intake 1480.44 ml  Output --  Net 1480.44 ml   Filed Weights   01/12/24 1159  Weight: 68 kg    Examination:  Physical Exam Vitals and nursing note reviewed.  Constitutional:      General: He is not in acute distress.    Appearance: He is normal weight. He is not toxic-appearing or diaphoretic.  HENT:     Nose: Nose normal.  Eyes:     General: No scleral icterus. Cardiovascular:     Rate and Rhythm: Normal rate and regular rhythm.  Pulmonary:     Effort: Pulmonary effort is normal. No respiratory distress.  Abdominal:     General: Abdomen is flat. There is no distension.  Musculoskeletal:     Right lower leg: No edema.     Left lower leg: No edema.  Neurological:     General: No focal  deficit present.     Mental Status: He is alert and oriented to person, place, and time.     Data Reviewed: I have personally reviewed following labs and imaging studies  CBC: Recent Labs  Lab 01/12/24 1212 01/13/24 0500  WBC 4.2 3.7*  HGB 13.5 13.5  HCT 40.9 40.0  MCV 89.3 88.1  PLT 408* 351   Basic Metabolic Panel: Recent Labs  Lab 01/12/24 1212 01/13/24 0221 01/13/24 0500  NA 136  --  134*  K 4.6  --  3.9  CL 100  --  100  CO2 24  --  24  GLUCOSE 147*  --  183*  BUN 16  --  10  CREATININE 1.09  --  0.84  CALCIUM  9.5  --  8.9  MG  --  2.3  --   PHOS  --  3.3  --    GFR: Estimated Creatinine Clearance: 85.4 mL/min (by C-G formula based on SCr of 0.84 mg/dL). Liver Function Tests: Recent Labs  Lab 01/12/24 1212 01/13/24 0500  AST 26 26  ALT 14 12  ALKPHOS 69 65  BILITOT 1.7* 1.1  PROT 8.3* 7.4  ALBUMIN 4.0 3.7   Coagulation Profile: Recent Labs  Lab 01/12/24 1410  INR 1.0   Cardiac Enzymes: Recent Labs  Lab 01/13/24 0221  CKTOTAL 343   HbA1C: Recent Labs    01/12/24 1212  HGBA1C 6.4*   CBG: Recent Labs  Lab 01/12/24 2313 01/13/24 0102 01/13/24 0309 01/13/24 0743 01/13/24 1128  GLUCAP 277* 161* 101* 207* 114*   Lipid Profile: Recent Labs    01/13/24 0500  CHOL 182  HDL 30*  LDLCALC 126*  TRIG 132  CHOLHDL 6.1   Thyroid  Function Tests: Recent Labs    01/12/24 1410 01/13/24 0221  TSH 0.879  --   FREET4  --  0.87   Anemia Panel: Recent Labs    01/13/24 0221  VITAMINB12 176*  FOLATE 10.5  FERRITIN 221  TIBC 291  IRON 75  RETICCTPCT 1.9   Radiology Studies: MR LUMBAR SPINE W WO CONTRAST Result Date: 01/13/2024 CLINICAL DATA:  63 year old male with lumbar radiculopathy. EXAM: MRI LUMBAR SPINE WITHOUT AND WITH CONTRAST TECHNIQUE: Multiplanar and multiecho pulse sequences of the lumbar spine were obtained without and with intravenous contrast. CONTRAST:  7mL GADAVIST  GADOBUTROL  1 MMOL/ML IV SOLN COMPARISON:  Lumbar MRI  10/24/2017.  Lumbar radiographs 11/12/2017. FINDINGS: Segmentation: Normal on the comparison radiographs, the same numbering system used on the 2019 MRI. Alignment: Chronic straightening of lumbar lordosis. Subtle anterolisthesis of L4 on L5. Mild underlying dextroconvex lumbar scoliosis suspected. Vertebrae: Normal background bone marrow signal. Stable, maintained vertebral height since 2019. Chronic but progressed degenerative endplate marrow signal changes at L4-L5 asymmetric to the left. Patchy marrow edema there. Underlying vacuum disc at that level. See additional details of that level below. Bulky anterior endplate osteophytosis at L1-L2. But no marrow edema or acute osseous abnormality. Intact visible sacrum and SI joints. Conus medullaris and cauda equina: Conus extends to the L1-L2 level. Probable vascular related enhancement seen along the conus medullaris. No convincing abnormal intradural enhancement. No suspicious dural thickening. Paraspinal and other soft tissues: Negative. Benign left renal midpole cyst (no follow-up imaging recommended). Disc levels: T12-L1:  Negative. L1-L2:  Bulky anterior disc osteophyte degeneration.  No stenosis. L2-L3: Circumferential disc bulging. Mild facet and ligament flavum hypertrophy. Mild multifactorial spinal stenosis, in part related to epidural lipomatosis. Mild left and moderate right L2 foraminal stenosis. This level is stable since 2019. L3-L4: Moderate multifactorial spinal stenosis with circumferential disc bulge, endplate spurring, up to moderate facet and ligament flavum hypertrophy, mild epidural lipomatosis. Symmetric lateral recess involvement. These findings are stable. But moderate to severe left L3 neural foraminal stenosis appears progressed since 2019 on series 5, image 11. Mild right foraminal stenosis. L4-L5: Chronic but progressed and severe multifactorial spinal and bilateral lateral recess stenosis (series 8, image 31, descending L5 nerve  levels). Vacuum disc and disc space loss. Bulky circumferential disc osteophyte complex asymmetric to the left. Severe ligament flavum, moderate to severe facet hypertrophy greater on the left. Severe left L4 neural foraminal stenosis also appears progressed. Only mild right L4 foraminal stenosis. L5-S1: Mild disc bulging and endplate spurring. Mild to moderate facet hypertrophy. No spinal or convincing lateral recess stenosis. Mild to moderate bilateral L5 foraminal stenosis has not significantly changed. IMPRESSION: 1. Chronic lumbar spine degeneration. Chronic but progressed and severe multifactorial spinal, lateral recess, and left foraminal stenosis at L4-L5. Query left L4 and/or bilateral L5 radiculitis. 2. Stable Moderate multifactorial spinal stenosis at L3-L4 since 2019. But moderate to severe Left foraminal stenosis there appears progressed. Query left L3 radiculitis.  3. Stable mild spinal stenosis at L2-L3. Electronically Signed   By: VEAR Hurst M.D.   On: 01/13/2024 12:48   ECHOCARDIOGRAM COMPLETE Result Date: 01/13/2024    ECHOCARDIOGRAM REPORT   Patient Name:   AVERY KLINGBEIL [facility] Date of Exam: 01/13/2024 Medical Rec #:  [id number]      Height:       72.0 in Accession #:    [id number]     Weight:       150.0 lb Date of Birth:  23-Feb-1961      BSA:          1.885 m Patient Age:    63 years       BP:           120/93 mmHg Patient Gender: M              HR:           81 bpm. Exam Location:  Inpatient Procedure: 2D Echo, Cardiac Doppler and Color Doppler (Both Spectral and Color            Flow Doppler were utilized during procedure). Indications:    Stroke I63.9  History:        Patient has no prior history of Echocardiogram examinations.                 Stroke; Risk Factors:Hypertension and Diabetes.  Sonographer:    Thea Norlander RCS Referring Phys: [id number] ANASTASSIA DOUTOVA IMPRESSIONS  1. Left ventricular ejection fraction, by estimation, is 40 to 45%. The left ventricle has mildly decreased function. The  left ventricle demonstrates global hypokinesis. Left ventricular diastolic parameters are consistent with Grade I diastolic dysfunction (impaired relaxation).  2. Right ventricular systolic function is normal. The right ventricular size is normal.  3. The mitral valve is normal in structure. Trivial mitral valve regurgitation. No evidence of mitral stenosis.  4. The aortic valve is tricuspid. Aortic valve regurgitation is mild. No aortic stenosis is present.  5. Aortic dilatation noted. There is mild dilatation of the aortic root, measuring 44 mm.  6. The inferior vena cava is normal in size with greater than 50% respiratory variability, suggesting right atrial pressure of 3 mmHg. FINDINGS  Left Ventricle: Left ventricular ejection fraction, by estimation, is 40 to 45%. The left ventricle has mildly decreased function. The left ventricle demonstrates global hypokinesis. The left ventricular internal cavity size was normal in size. There is  no left ventricular hypertrophy. Left ventricular diastolic parameters are consistent with Grade I diastolic dysfunction (impaired relaxation). Right Ventricle: The right ventricular size is normal. No increase in right ventricular wall thickness. Right ventricular systolic function is normal. Left Atrium: Left atrial size was normal in size. Right Atrium: Right atrial size was normal in size. Prominent Eustachian valve. Pericardium: There is no evidence of pericardial effusion. Mitral Valve: The mitral valve is normal in structure. There is mild thickening of the anterior and posterior mitral valve leaflet(s). Trivial mitral valve regurgitation. No evidence of mitral valve stenosis. Tricuspid Valve: The tricuspid valve is normal in structure. Tricuspid valve regurgitation is not demonstrated. No evidence of tricuspid stenosis. Aortic Valve: The aortic valve is tricuspid. Aortic valve regurgitation is mild. No aortic stenosis is present. Aortic valve peak gradient measures 3.1  mmHg. Pulmonic Valve: The pulmonic valve was normal in structure. Pulmonic valve regurgitation is not visualized. No evidence of pulmonic stenosis. Aorta: The aortic root is normal in size and structure and aortic dilatation noted. There is mild dilatation  of the aortic root, measuring 44 mm. Venous: The inferior vena cava is normal in size with greater than 50% respiratory variability, suggesting right atrial pressure of 3 mmHg. IAS/Shunts: No atrial level shunt detected by color flow Doppler.  LEFT VENTRICLE PLAX 2D LVIDd:         3.40 cm     Diastology LVIDs:         2.50 cm     LV e' medial:    5.00 cm/s LV PW:         1.20 cm     LV E/e' medial:  9.9 LV IVS:        1.10 cm     LV e' lateral:   8.92 cm/s LVOT diam:     2.30 cm     LV E/e' lateral: 5.5 LV SV:         46 LV SV Index:   24 LVOT Area:     4.15 cm  LV Volumes (MOD) LV vol d, MOD A4C: 90.5 ml LV vol s, MOD A4C: 51.0 ml LV SV MOD A4C:     39.5 ml RIGHT VENTRICLE             IVC RV S prime:     11.60 cm/s  IVC diam: 1.30 cm TAPSE (M-mode): 1.6 cm LEFT ATRIUM             Index        RIGHT ATRIUM           Index LA diam:        2.30 cm 1.22 cm/m   RA Area:     13.00 cm LA Vol (A2C):   23.4 ml 12.41 ml/m  RA Volume:   30.30 ml  16.07 ml/m LA Vol (A4C):   15.9 ml 8.43 ml/m LA Biplane Vol: 20.9 ml 11.09 ml/m  AORTIC VALVE AV Area (Vmax): 3.17 cm AV Vmax:        87.85 cm/s AV Peak Grad:   3.1 mmHg LVOT Vmax:      67.10 cm/s LVOT Vmean:     45.600 cm/s LVOT VTI:       0.110 m  AORTA Ao Root diam: 4.40 cm Ao Asc diam:  3.90 cm MITRAL VALVE MV Area (PHT): 2.76 cm    SHUNTS MV Decel Time: 275 msec    Systemic VTI:  0.11 m MV E velocity: 49.30 cm/s  Systemic Diam: 2.30 cm MV A velocity: 69.70 cm/s MV E/A ratio:  0.71 Mihai Croitoru MD Electronically signed by Jerel Balding MD Signature Date/Time: 01/13/2024/11:30:45 AM    Final    DG CHEST PORT 1 VIEW Result Date: 01/12/2024 EXAM: 1 VIEW XRAY OF THE CHEST 01/12/2024 10:27:06 PM COMPARISON: 01/27/2018  CLINICAL HISTORY: [id number] Stroke (HCC) [id number]. Stroke [facility]) [phone] FINDINGS: LUNGS AND PLEURA: No focal pulmonary opacity. No pulmonary edema. No pleural effusion. No pneumothorax. HEART AND MEDIASTINUM: No acute abnormality of the cardiac and mediastinal silhouettes. BONES AND SOFT TISSUES: No acute osseous abnormality. IMPRESSION: 1. No acute process. Electronically signed by: Pinkie Pebbles MD 01/12/2024 10:29 PM EDT RP Workstation: [id number]   MR Brain W and Wo Contrast Result Date: 01/12/2024 EXAM: MRI BRAIN WITH AND WITHOUT CONTRAST 01/12/2024 08:30:54 PM TECHNIQUE: Multiplanar multisequence MRI of the head/brain was performed with and without the administration of intravenous contrast. COMPARISON: 05/14/2015 CLINICAL HISTORY: Neuro deficit, acute, stroke suspected; History of prostate cancer, [place] without medical care. Now  left-sided weakness and unsteadiness with multiple falls. FINDINGS: BRAIN AND VENTRICLES: Punctate focus of acute/early subacute ischemia at the posterior left frontal operculum. Generalized volume loss. Early confluent hyperintense T2-weighted signal within the cerebral white matter, most commonly due to chronic small vessel disease. No acute intracranial hemorrhage. No mass effect or midline shift. No hydrocephalus. The sella is unremarkable. Normal flow voids. No mass or abnormal enhancement. ORBITS: Ocular lens replacements. No acute abnormality. SINUSES: Left maxillary sinus retention cysts. No acute abnormality. BONES AND SOFT TISSUES: Normal bone marrow signal and enhancement. No acute soft tissue abnormality. IMPRESSION: 1. Punctate focus of acute/early subacute ischemia at the posterior left frontal operculum. 2. Early confluent T2 hyperintense signal within the cerebral white matter, most commonly due to chronic small vessel disease. 3. Generalized volume loss. Electronically signed by: Franky Stanford MD 01/12/2024 09:06 PM EDT RP  Workstation: [id number]   CT ANGIO HEAD NECK W WO CM Result Date: 01/12/2024 CLINICAL DATA:  Provided history: Neuro deficit, acute, stroke suspected. EXAM: CT ANGIOGRAPHY HEAD AND NECK WITH AND WITHOUT CONTRAST TECHNIQUE: Multidetector CT imaging of the head and neck was performed using the standard protocol during bolus administration of intravenous contrast. Multiplanar CT image reconstructions and MIPs were obtained to evaluate the vascular anatomy. Carotid stenosis measurements (when applicable) are obtained utilizing NASCET criteria, using the distal internal carotid diameter as the denominator. RADIATION DOSE REDUCTION: This exam was performed according to the departmental dose-optimization program which includes automated exposure control, adjustment of the mA and/or kV according to patient size and/or use of iterative reconstruction technique. CONTRAST:  75mL OMNIPAQUE  IOHEXOL  350 MG/ML SOLN COMPARISON:  Brain MRI 05/14/2015. FINDINGS: CT HEAD FINDINGS Brain: Mild-to-moderate prominence of the lateral third ventricles, slightly progressed from the prior MRI of 05/14/2015. This is at least partly due to cerebral atrophy. However, there also imaging features which can be seen in the setting of normal pressure hydrocephalus (crowding of the sulci at the level of the high frontoparietal lobes, an acute callosal angle and prominence of the sylvian fissures), and a component of NPH is possible in the appropriate clinical setting. Patchy and ill-defined hypoattenuation within the cerebral white matter, nonspecific but compatible with mild-to-moderate chronic small vessel ischemic disease. There is no acute intracranial hemorrhage. No demarcated cortical infarct. No extra-axial fluid collection. No evidence of an intracranial mass. No midline shift. Vascular: No hyperdense vessel. Skull: No mass or acute finding within the imaged orbits. Sinuses/Orbits: No orbital mass or acute orbital finding. Mucous retention  cysts within the left maxillary sinus measuring up to 17 mm. Review of the MIP images confirms the above findings CTA NECK FINDINGS Aortic arch: Standard aortic branching. Atherosclerotic plaque within the visualized aortic arch. Streak/beam hardening artifact arising from a dense contrast bolus partially obscures the left subclavian artery. Within this limitation, there is no appreciable hemodynamically significant innominate or proximal subclavian artery stenosis. Right carotid system: CCA and ICA patent within the neck without stenosis or significant atherosclerotic disease. Tortuosity of the cervical ICA. Left carotid system: Streak/beam hardening artifact partially obscures the proximal common carotid artery. Within this limitation, the common carotid and internal carotid arteries are patent within the neck without hemodynamically significant stenosis (50% or greater). Minimal nonstenotic atherosclerotic plaque within the CCA. Mild-to-moderate atherosclerotic plaque within the proximal ICA. Vertebral arteries: Venous reflux of contrast limits evaluation of the left vertebral artery origin. Within this limitation, the vertebral arteries are codominant and patent within the neck without stenosis or significant atherosclerotic disease. Skeleton: Levocurvature of the  cervical spine. Dextrocurvature of the upper thoracic spine, partially imaged. Cervical spondylosis. No acute fracture or aggressive osseous lesion. Other neck: No neck mass or cervical lymphadenopathy. Upper chest: No consolidation within the imaged lung apices. Review of the MIP images confirms the above findings CTA HEAD FINDINGS Anterior circulation: The intracranial internal carotid arteries are patent. The M1 middle cerebral arteries are patent. No M2 proximal branch occlusion or high-grade proximal stenosis. The anterior cerebral arteries are patent. Mildly hypoplastic left A1 segment. No intracranial aneurysm is identified. Posterior  circulation: The intracranial vertebral arteries are patent. The basilar artery is patent. The posterior cerebral arteries are patent. Atherosclerotic irregularity of both vessels without high-grade proximal stenosis. Hypoplastic left P1 segment with sizable left posterior communicating artery. A right posterior communicating artery is also present. Venous sinuses: Evaluation for no venous sinus thrombosis is limited due to contrast timing. Anatomic variants: As described. Review of the MIP images confirms the above findings IMPRESSION: Non-contrast head CT: 1.  No evidence of an acute intracranial abnormality. 2. Mild-to-moderate cerebral white matter chronic small vessel ischemic disease. 3. Mild-to-moderate prominence of the lateral third ventricles, slightly progressed from the prior MRI of 05/14/2015. This is at least partly due to cerebral atrophy. However, there also imaging features which can be seen in the setting of normal pressure hydrocephalus, and a component of NPH is possible in the appropriate clinical setting. 4. Mucous retention cysts within the left maxillary sinus (measuring up to 17 mm). CTA neck: 1. Artifact partially obscures the proximal left common carotid artery and obscures the left vertebral artery origin. Within this limitation, findings are as follows. 2. The common carotid and internal carotid arteries are patent within the neck without hemodynamically significant stenosis. Atherosclerotic plaque bilaterally (left greater than right), as described. 3. Vertebral arteries patent within the neck without stenosis or significant disc disease. 4. Aortic Atherosclerosis (ICD10-I70.0). 5. Cervical spondylosis. CTA head: No proximal intracranial large vessel occlusion or high-grade proximal arterial stenosis identified. Electronically Signed   By: Rockey Childs D.O.   On: 01/12/2024 19:01    Scheduled Meds:  [START ON 01/14/2024] aspirin  EC  81 mg Oral Daily   atorvastatin   40 mg Oral Daily    clopidogrel   75 mg Oral Daily   vitamin B-12  1,000 mcg Oral Daily   heparin  injection (subcutaneous)  5,000 Units Subcutaneous Q8H   insulin  aspart  0-9 Units Subcutaneous TID WC   lisinopril   10 mg Oral Daily   Continuous Infusions:   LOS: 0 days   Time spent: 55 minutes  Camellia Door, DO  [facility]  01/13/2024, 2:16 PM

## 2024-01-13 NOTE — Assessment & Plan Note (Signed)
 01-13-2024 pt states he just returned to [place]  after spending several years living in [place]. He does not have a job and is living with some friends.

## 2024-01-13 NOTE — Progress Notes (Addendum)
 STROKE TEAM PROGRESS NOTE   SUBJECTIVE (INTERVAL HISTORY) No family is at the bedside.  Overall his condition is unchanged. He is pleasant and stated that for the last one year, he had worsening LBP, and left buttock pain, comes and goes, associated with left leg numbness and weakness. He had frequent falls on walking especially with buttock pain, and left leg intermittent numbness and weakness.    OBJECTIVE Temp:  [97.6 F (36.4 C)-98.5 F (36.9 C)] 98 F (36.7 C) (07/20 1153) Pulse Rate:  [73-96] 81 (07/20 1153) Cardiac Rhythm: Sinus tachycardia (07/20 0615) Resp:  [13-29] 16 (07/20 1153) BP: (113-168)/(62-124) 156/91 (07/20 1153) SpO2:  [97 %-100 %] 100 % (07/20 1153)  Recent Labs  Lab 01/12/24 2313 01/13/24 0102 01/13/24 0309 01/13/24 0743 01/13/24 1128  GLUCAP 277* 161* 101* 207* 114*   Recent Labs  Lab 01/12/24 1212 01/13/24 0221 01/13/24 0500  NA 136  --  134*  K 4.6  --  3.9  CL 100  --  100  CO2 24  --  24  GLUCOSE 147*  --  183*  BUN 16  --  10  CREATININE 1.09  --  0.84  CALCIUM  9.5  --  8.9  MG  --  2.3  --   PHOS  --  3.3  --    Recent Labs  Lab 01/12/24 1212 01/13/24 0500  AST 26 26  ALT 14 12  ALKPHOS 69 65  BILITOT 1.7* 1.1  PROT 8.3* 7.4  ALBUMIN 4.0 3.7   Recent Labs  Lab 01/12/24 1212 01/13/24 0500  WBC 4.2 3.7*  HGB 13.5 13.5  HCT 40.9 40.0  MCV 89.3 88.1  PLT 408* 351   Recent Labs  Lab 01/13/24 0221  CKTOTAL 343   Recent Labs    01/12/24 1410  LABPROT 14.2  INR 1.0   Recent Labs    01/12/24 1246  COLORURINE YELLOW  LABSPEC 1.026  PHURINE 5.0  GLUCOSEU NEGATIVE  HGBUR NEGATIVE  BILIRUBINUR NEGATIVE  KETONESUR 5*  PROTEINUR 30*  NITRITE NEGATIVE  LEUKOCYTESUR NEGATIVE       Component Value Date/Time   CHOL 182 01/13/2024 0500   TRIG 132 01/13/2024 0500   HDL 30 (L) 01/13/2024 0500   CHOLHDL 6.1 01/13/2024 0500   VLDL 26 01/13/2024 0500   LDLCALC 126 (H) 01/13/2024 0500   Lab Results  Component Value  Date   HGBA1C 6.4 (H) 01/12/2024   No results found for: LABOPIA, COCAINSCRNUR, LABBENZ, AMPHETMU, THCU, LABBARB  Recent Labs  Lab 01/13/24 0221  ETH <15    I have personally reviewed the radiological images below and agree with the radiology interpretations.  ECHOCARDIOGRAM COMPLETE Result Date: 01/13/2024    ECHOCARDIOGRAM REPORT   Patient Name:   Douglas Stewart [facility] Date of Exam: 01/13/2024 Medical Rec #:  [id number]      Height:       72.0 in Accession #:    [id number]     Weight:       150.0 lb Date of Birth:  11/13/1960      BSA:          1.885 m Patient Age:    63 years       BP:           120/93 mmHg Patient Gender: M              HR:           81 bpm. Exam Location:  Inpatient  Procedure: 2D Echo, Cardiac Doppler and Color Doppler (Both Spectral and Color            Flow Doppler were utilized during procedure). Indications:    Stroke I63.9  History:        Patient has no prior history of Echocardiogram examinations.                 Stroke; Risk Factors:Hypertension and Diabetes.  Sonographer:    Thea Norlander RCS Referring Phys: [id number] ANASTASSIA DOUTOVA IMPRESSIONS  1. Left ventricular ejection fraction, by estimation, is 40 to 45%. The left ventricle has mildly decreased function. The left ventricle demonstrates global hypokinesis. Left ventricular diastolic parameters are consistent with Grade I diastolic dysfunction (impaired relaxation).  2. Right ventricular systolic function is normal. The right ventricular size is normal.  3. The mitral valve is normal in structure. Trivial mitral valve regurgitation. No evidence of mitral stenosis.  4. The aortic valve is tricuspid. Aortic valve regurgitation is mild. No aortic stenosis is present.  5. Aortic dilatation noted. There is mild dilatation of the aortic root, measuring 44 mm.  6. The inferior vena cava is normal in size with greater than 50% respiratory variability, suggesting right atrial pressure of 3 mmHg. FINDINGS  Left Ventricle:  Left ventricular ejection fraction, by estimation, is 40 to 45%. The left ventricle has mildly decreased function. The left ventricle demonstrates global hypokinesis. The left ventricular internal cavity size was normal in size. There is  no left ventricular hypertrophy. Left ventricular diastolic parameters are consistent with Grade I diastolic dysfunction (impaired relaxation). Right Ventricle: The right ventricular size is normal. No increase in right ventricular wall thickness. Right ventricular systolic function is normal. Left Atrium: Left atrial size was normal in size. Right Atrium: Right atrial size was normal in size. Prominent Eustachian valve. Pericardium: There is no evidence of pericardial effusion. Mitral Valve: The mitral valve is normal in structure. There is mild thickening of the anterior and posterior mitral valve leaflet(s). Trivial mitral valve regurgitation. No evidence of mitral valve stenosis. Tricuspid Valve: The tricuspid valve is normal in structure. Tricuspid valve regurgitation is not demonstrated. No evidence of tricuspid stenosis. Aortic Valve: The aortic valve is tricuspid. Aortic valve regurgitation is mild. No aortic stenosis is present. Aortic valve peak gradient measures 3.1 mmHg. Pulmonic Valve: The pulmonic valve was normal in structure. Pulmonic valve regurgitation is not visualized. No evidence of pulmonic stenosis. Aorta: The aortic root is normal in size and structure and aortic dilatation noted. There is mild dilatation of the aortic root, measuring 44 mm. Venous: The inferior vena cava is normal in size with greater than 50% respiratory variability, suggesting right atrial pressure of 3 mmHg. IAS/Shunts: No atrial level shunt detected by color flow Doppler.  LEFT VENTRICLE PLAX 2D LVIDd:         3.40 cm     Diastology LVIDs:         2.50 cm     LV e' medial:    5.00 cm/s LV PW:         1.20 cm     LV E/e' medial:  9.9 LV IVS:        1.10 cm     LV e' lateral:   8.92 cm/s  LVOT diam:     2.30 cm     LV E/e' lateral: 5.5 LV SV:         46 LV SV Index:   24 LVOT Area:  4.15 cm  LV Volumes (MOD) LV vol d, MOD A4C: 90.5 ml LV vol s, MOD A4C: 51.0 ml LV SV MOD A4C:     39.5 ml RIGHT VENTRICLE             IVC RV S prime:     11.60 cm/s  IVC diam: 1.30 cm TAPSE (M-mode): 1.6 cm LEFT ATRIUM             Index        RIGHT ATRIUM           Index LA diam:        2.30 cm 1.22 cm/m   RA Area:     13.00 cm LA Vol (A2C):   23.4 ml 12.41 ml/m  RA Volume:   30.30 ml  16.07 ml/m LA Vol (A4C):   15.9 ml 8.43 ml/m LA Biplane Vol: 20.9 ml 11.09 ml/m  AORTIC VALVE AV Area (Vmax): 3.17 cm AV Vmax:        87.85 cm/s AV Peak Grad:   3.1 mmHg LVOT Vmax:      67.10 cm/s LVOT Vmean:     45.600 cm/s LVOT VTI:       0.110 m  AORTA Ao Root diam: 4.40 cm Ao Asc diam:  3.90 cm MITRAL VALVE MV Area (PHT): 2.76 cm    SHUNTS MV Decel Time: 275 msec    Systemic VTI:  0.11 m MV E velocity: 49.30 cm/s  Systemic Diam: 2.30 cm MV A velocity: 69.70 cm/s MV E/A ratio:  0.71 Mihai Croitoru MD Electronically signed by Jerel Balding MD Signature Date/Time: 01/13/2024/11:30:45 AM    Final    DG CHEST PORT 1 VIEW Result Date: 01/12/2024 EXAM: 1 VIEW XRAY OF THE CHEST 01/12/2024 10:27:06 PM COMPARISON: 01/27/2018 CLINICAL HISTORY: [id number] Stroke (HCC) [id number]. Stroke [facility]) [phone] FINDINGS: LUNGS AND PLEURA: No focal pulmonary opacity. No pulmonary edema. No pleural effusion. No pneumothorax. HEART AND MEDIASTINUM: No acute abnormality of the cardiac and mediastinal silhouettes. BONES AND SOFT TISSUES: No acute osseous abnormality. IMPRESSION: 1. No acute process. Electronically signed by: Pinkie Pebbles MD 01/12/2024 10:29 PM EDT RP Workstation: [id number]   MR Brain W and Wo Contrast Result Date: 01/12/2024 EXAM: MRI BRAIN WITH AND WITHOUT CONTRAST 01/12/2024 08:30:54 PM TECHNIQUE: Multiplanar multisequence MRI of the head/brain was performed with and without the administration of intravenous contrast. COMPARISON:  05/14/2015 CLINICAL HISTORY: Neuro deficit, acute, stroke suspected; History of prostate cancer, [place] without medical care. Now left-sided weakness and unsteadiness with multiple falls. FINDINGS: BRAIN AND VENTRICLES: Punctate focus of acute/early subacute ischemia at the posterior left frontal operculum. Generalized volume loss. Early confluent hyperintense T2-weighted signal within the cerebral white matter, most commonly due to chronic small vessel disease. No acute intracranial hemorrhage. No mass effect or midline shift. No hydrocephalus. The sella is unremarkable. Normal flow voids. No mass or abnormal enhancement. ORBITS: Ocular lens replacements. No acute abnormality. SINUSES: Left maxillary sinus retention cysts. No acute abnormality. BONES AND SOFT TISSUES: Normal bone marrow signal and enhancement. No acute soft tissue abnormality. IMPRESSION: 1. Punctate focus of acute/early subacute ischemia at the posterior left frontal operculum. 2. Early confluent T2 hyperintense signal within the cerebral white matter, most commonly due to chronic small vessel disease. 3. Generalized volume loss. Electronically signed by: Franky Stanford MD 01/12/2024 09:06 PM EDT RP Workstation: [id number]   CT ANGIO HEAD NECK W WO CM Result Date: 01/12/2024 CLINICAL DATA:  Provided  history: Neuro deficit, acute, stroke suspected. EXAM: CT ANGIOGRAPHY HEAD AND NECK WITH AND WITHOUT CONTRAST TECHNIQUE: Multidetector CT imaging of the head and neck was performed using the standard protocol during bolus administration of intravenous contrast. Multiplanar CT image reconstructions and MIPs were obtained to evaluate the vascular anatomy. Carotid stenosis measurements (when applicable) are obtained utilizing NASCET criteria, using the distal internal carotid diameter as the denominator. RADIATION DOSE REDUCTION: This exam was performed according to the departmental dose-optimization program which  includes automated exposure control, adjustment of the mA and/or kV according to patient size and/or use of iterative reconstruction technique. CONTRAST:  75mL OMNIPAQUE  IOHEXOL  350 MG/ML SOLN COMPARISON:  Brain MRI 05/14/2015. FINDINGS: CT HEAD FINDINGS Brain: Mild-to-moderate prominence of the lateral third ventricles, slightly progressed from the prior MRI of 05/14/2015. This is at least partly due to cerebral atrophy. However, there also imaging features which can be seen in the setting of normal pressure hydrocephalus (crowding of the sulci at the level of the high frontoparietal lobes, an acute callosal angle and prominence of the sylvian fissures), and a component of NPH is possible in the appropriate clinical setting. Patchy and ill-defined hypoattenuation within the cerebral white matter, nonspecific but compatible with mild-to-moderate chronic small vessel ischemic disease. There is no acute intracranial hemorrhage. No demarcated cortical infarct. No extra-axial fluid collection. No evidence of an intracranial mass. No midline shift. Vascular: No hyperdense vessel. Skull: No mass or acute finding within the imaged orbits. Sinuses/Orbits: No orbital mass or acute orbital finding. Mucous retention cysts within the left maxillary sinus measuring up to 17 mm. Review of the MIP images confirms the above findings CTA NECK FINDINGS Aortic arch: Standard aortic branching. Atherosclerotic plaque within the visualized aortic arch. Streak/beam hardening artifact arising from a dense contrast bolus partially obscures the left subclavian artery. Within this limitation, there is no appreciable hemodynamically significant innominate or proximal subclavian artery stenosis. Right carotid system: CCA and ICA patent within the neck without stenosis or significant atherosclerotic disease. Tortuosity of the cervical ICA. Left carotid system: Streak/beam hardening artifact partially obscures the proximal common carotid artery.  Within this limitation, the common carotid and internal carotid arteries are patent within the neck without hemodynamically significant stenosis (50% or greater). Minimal nonstenotic atherosclerotic plaque within the CCA. Mild-to-moderate atherosclerotic plaque within the proximal ICA. Vertebral arteries: Venous reflux of contrast limits evaluation of the left vertebral artery origin. Within this limitation, the vertebral arteries are codominant and patent within the neck without stenosis or significant atherosclerotic disease. Skeleton: Levocurvature of the cervical spine. Dextrocurvature of the upper thoracic spine, partially imaged. Cervical spondylosis. No acute fracture or aggressive osseous lesion. Other neck: No neck mass or cervical lymphadenopathy. Upper chest: No consolidation within the imaged lung apices. Review of the MIP images confirms the above findings CTA HEAD FINDINGS Anterior circulation: The intracranial internal carotid arteries are patent. The M1 middle cerebral arteries are patent. No M2 proximal branch occlusion or high-grade proximal stenosis. The anterior cerebral arteries are patent. Mildly hypoplastic left A1 segment. No intracranial aneurysm is identified. Posterior circulation: The intracranial vertebral arteries are patent. The basilar artery is patent. The posterior cerebral arteries are patent. Atherosclerotic irregularity of both vessels without high-grade proximal stenosis. Hypoplastic left P1 segment with sizable left posterior communicating artery. A right posterior communicating artery is also present. Venous sinuses: Evaluation for no venous sinus thrombosis is limited due to contrast timing. Anatomic variants: As described. Review of the MIP images confirms the above findings IMPRESSION: Non-contrast head  CT: 1.  No evidence of an acute intracranial abnormality. 2. Mild-to-moderate cerebral white matter chronic small vessel ischemic disease. 3. Mild-to-moderate prominence of  the lateral third ventricles, slightly progressed from the prior MRI of 05/14/2015. This is at least partly due to cerebral atrophy. However, there also imaging features which can be seen in the setting of normal pressure hydrocephalus, and a component of NPH is possible in the appropriate clinical setting. 4. Mucous retention cysts within the left maxillary sinus (measuring up to 17 mm). CTA neck: 1. Artifact partially obscures the proximal left common carotid artery and obscures the left vertebral artery origin. Within this limitation, findings are as follows. 2. The common carotid and internal carotid arteries are patent within the neck without hemodynamically significant stenosis. Atherosclerotic plaque bilaterally (left greater than right), as described. 3. Vertebral arteries patent within the neck without stenosis or significant disc disease. 4. Aortic Atherosclerosis (ICD10-I70.0). 5. Cervical spondylosis. CTA head: No proximal intracranial large vessel occlusion or high-grade proximal arterial stenosis identified. Electronically Signed   By: Rockey Childs D.O.   On: 01/12/2024 19:01     PHYSICAL EXAM  Temp:  [97.6 F (36.4 C)-98.5 F (36.9 C)] 98 F (36.7 C) (07/20 1153) Pulse Rate:  [73-96] 81 (07/20 1153) Resp:  [13-29] 16 (07/20 1153) BP: (113-168)/(62-124) 156/91 (07/20 1153) SpO2:  [97 %-100 %] 100 % (07/20 1153)  General - Well nourished, well developed, in no apparent distress.  Ophthalmologic - fundi not visualized due to noncooperation.  Cardiovascular - Regular rhythm and rate.  Mental Status -  Level of arousal and orientation to time, place, and person were intact. Language including expression, naming, repetition, comprehension was assessed and found intact. Attention span and concentration were normal. Fund of Knowledge was assessed and was intact.  Cranial Nerves II - XII - II - Visual field intact OU. III, IV, VI - Extraocular movements intact. V - Facial  sensation intact bilaterally. VII - Facial movement intact bilaterally. VIII - Hearing & vestibular intact bilaterally X - Palate elevates symmetrically. XI - Chin turning & shoulder shrug intact bilaterally. XII - Tongue protrusion intact.  Motor Strength - The patient's strength was normal in all extremities and pronator drift was absent.  Bulk was normal and fasciculations were absent except left calf muscle wasting.   Motor Tone - Muscle tone was assessed at the neck and appendages and was normal.  Reflexes - The patient's reflexes were symmetrical in all extremities and he had no pathological reflexes.  Sensory - Light touch, temperature/pinprick were assessed and were symmetrical except left medial LE and foot numbness.  B/l LE proprioception reserved.    Coordination - The patient had normal movements in the hands and feet with no ataxia or dysmetria with eyes open, mild dysmetria with on HTS bilaterally with eyes closed.  Tremor was absent. Romberg test negative.   Gait and Station - walked in room and hallway, no magnetic gait, slight stooped posturing, no tendency to fall   ASSESSMENT/PLAN Douglas Stewart is a 63 y.o. male with history of HTN, prostate cancer admitted for gait difficulty with frequent falls for the last one year. No TNK given due to outside window.    Gait difficulty - likely due to lumbar radiculopathy, could be partially from B12 deficiency for the last one year, he had worsening LBP, and left buttock pain, comes and goes, associated with left leg numbness and weakness. He had frequent falls on walking especially with buttock pain, and  left leg intermittent numbness and weakness. No significant cognitive impairment or urine incontinence, no magnetic gait. CT head Mild-to-moderate prominence of the lateral third ventricles, slightly progressed from the prior MRI of 05/14/2015.  however, no clinical signs to support NPH at this time B/l LE proprioception  reserved. Romberg test negative, but did have mild dysmetria with HTS bilaterally with eyes closed.  On B12 supplement Lumbar MRI w/wo Chronic lumbar spine degeneration. Chronic but progressed and severe multifactorial spinal, lateral recess, and left foraminal stenosis at L4-L5. Query left L4 and/or bilateral L5 radiculitis. Stable Moderate multifactorial spinal stenosis at L3-L4 since 2019. But moderate to severe Left foraminal stenosis there appears progressed. Query left L3 radiculitis. Recommend outpt EMG/NCS study at [facility] PT recommend HH  Stroke, incidental finding:  left posterior frontal operculum infarct, likely secondary to small vessel disease source CT head no acute finding CTA head and neck unremarkable MRI  Punctate focus of acute/early subacute ischemia at the posterior left frontal operculum. 2D Echo  EF 40-45% LDL 126 HgbA1c 6.4 SCDs for VTE prophylaxis No antithrombotic prior to admission, now on aspirin  81 mg daily and clopidogrel  75 mg daily DAPT for 3 weeks and then ASA alone.  Patient counseled to be compliant with his antithrombotic medications Ongoing aggressive stroke risk factor management Therapy recommendations:  HH PT Disposition:  home today  B12 deficiency B12 = 176 On supplement Exam not fully support typical subacute combined degeneration  Will hold off MRI C/T spine at this time.   Hypertension Stable Long term BP goal normotensive  Hyperlipidemia Home meds:  none  LDL 126, goal < 70 Now on lipitor 40 Continue statin at discharge  Other Stroke Risk Factors   Other Active Problems Prostate cancer Leukocytopenia WBC 3.7  Hospital day # 0  Neurology will sign off. Please call with questions. Pt will follow up with stroke clinic NP at [facility] in about 4 weeks. Thanks for the consult.  I discussed with Dr. Laurence. I spent extensive total face-to-face time with the patient and family, reviewing test results, images and medication, and discussing the  diagnosis, treatment plan and potential prognosis. This patient's care requires review of multiple databases, neurological assessment, discussion with other specialists and medical decision making of high complexity.   Ary Cummins, MD PhD Stroke Neurology 01/13/2024 12:48 PM    To contact Stroke Continuity provider, please refer to [email or web address]. After hours, contact General Neurology

## 2024-01-13 NOTE — Evaluation (Signed)
 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 male with medical history significant of prostate cancer, HTN    DM2, who presented with falls and left sided weakness. MRI of head revealed, Punctate focus of acute/early subacute ischemia at the posterior left frontal operculum.   Assessment / Plan / Recommendation Clinical Impression  Pt appears to be at baseline functioning for cognitive linguistics vs mild cognitive impairment. No family members or caregiver present to clarify baseline functioning. He was oriented x3 with mild impairments to day of the week and day of the month. He was able to recall some hospital events. He does note some chronic short term memory difficulties. He states at baseline he lives with a friend in an apartment, not currently employed. He states indendence with ADLs and IADLs. Receptive and expressive language skills as well as motor speech skills were intact. He admitted he likely needs to resume medications for chronic conditions. Executive function skills and thought organization appeared mildly impaired per clinical interview. Will continue to monitor cognitive abilities in setting of novel CVA to  maximize safety and independence at DC. Pt likely to benefit from Child psychotherapist consult, asking for food resources at DC, communicated to RN.    SLP Assessment  SLP Recommendation/Assessment: Patient needs continued Speech Language Pathology Services SLP Visit Diagnosis: Cognitive communication deficit (R41.841)     Assistance Recommended at Discharge  None  Functional Status Assessment Patient has had a recent decline in their functional status and demonstrates the ability to make significant improvements in function in a reasonable and predictable amount of time.  Frequency and Duration min 1 x/week  1 week      SLP Evaluation Cognition  Overall Cognitive Status: Within Functional Limits for tasks assessed Orientation Level: Oriented to person;Oriented to place;Oriented to time;Oriented to situation Year: 2025 Month: July Day of Week: Incorrect Attention: Focused;Sustained Focused Attention: Appears intact Sustained Attention: Appears intact Memory: Impaired Memory Impairment: Decreased recall of new information Awareness: Appears intact Problem Solving: Impaired Problem Solving Impairment: Verbal complex Executive Function: Organizing;Self Monitoring Organizing: Impaired Self Monitoring: Impaired       Comprehension  Auditory Comprehension Overall Auditory Comprehension: Appears within functional limits for tasks assessed    Expression Expression Primary Mode of Expression: Verbal Verbal Expression Overall Verbal Expression: Appears within functional limits for tasks assessed Written Expression Dominant Hand: Right   Oral / Motor  Oral Motor/Sensory Function Overall Oral Motor/Sensory Function: Within functional limits Motor Speech Overall Motor Speech: Appears within functional limits for tasks assessed            Mitzie HUNT MA, CCC-SLP Acute Rehabilitation Services   01/13/2024, 1:32 PM

## 2024-01-13 NOTE — Assessment & Plan Note (Signed)
 01-13-2024 A1c 6.4%. he needs to f/u with PCP.

## 2024-01-13 NOTE — Assessment & Plan Note (Signed)
 01-13-2024 echo negative for shunt. LVEF 40-45%. On ASA, plavix  for 3 weeks, then asa alone. On lipitor 40 mg daily. Add lisinopril  10 mg daily.

## 2024-01-13 NOTE — ED Notes (Signed)
 Pt in MRI.

## 2024-01-13 NOTE — Discharge Summary (Signed)
 [facility] Physician Discharge Summary   Patient name: Douglas Stewart  Admit date:     01/12/2024  Discharge date: 01/13/2024  Attending Physician: DOUTOVA, ANASTASSIA [[id number]]  Discharge Physician: Camellia Door   PCP: Lennard Lesta FALCON, MD  Admitted From: Home  Disposition:  Home  Recommendations for Outpatient Follow-up:  Follow up with PCP in 1-2 weeks Follow up with stroke clinic Follow up with neurosurgery  Home Health:No. Outpatient consult sent for outpatient PT Equipment/Devices: rolling walker    Discharge Condition:Stable CODE STATUS:FULL Diet recommendation: Heart Healthy Fluid Restriction: None  Hospital Summary: HPI: Douglas Stewart is a 63 y.o. male with medical history significant of prostate cancer, HTN   DM2   Presented with  falls, left side weakness Patient coming in complaining of frequent falls Denies any chest pain no nausea no vomiting no diarrhea.   Has past history of prostate cancer but has not been followed up for past few years He did endorse that his left arm and leg felt weak He feels occasionally like the room was spinning occasional blurry vision no speech issues no pain Found to have subtotal left facial droop and mild left side weakness CTA showed no CVA but slightly prominent ventricles MRI showed small acute early subacute ischemia in the left frontal area  Significant Events: Admitted 01/12/2024 for acute CVA   Admission Labs: WBC 4.2, HgB 13.5, plt 408 Na 136, K 4.6, CO2 of 24, BUN 16, scr 1.09, glu 147 T prot 8.3, alb 4.0, AST 26, ALT 14, alk phos 69. T. Bili 1.7 UA negative nitrite, negative LE. Ketones 5, protein 30. Otherwise negative A1c 6.4% TSH 0.879 Fe 75, TIBC 291, %sat 26 ESR 51, crp 1.0, ferritin 221 Folate 10.5 Vit B12 176  Admission Imaging Studies: CT head No evidence of an acute intracranial abnormality. 2. Mild-to-moderate cerebral white matter chronic small vessel ischemic disease. 3. Mild-to-moderate  prominence of the lateral third ventricles, slightly progressed from the prior MRI of 05/14/2015. This is at least partly due to cerebral atrophy. However, there also imaging features which can be seen in the setting of normal pressure hydrocephalus, and a component of NPH is possible in the appropriate clinical setting. 4. Mucous retention cysts within the left maxillary sinus (measuring up to 17 mm)  CTA neck Artifact partially obscures the proximal left common carotid artery and obscures the left vertebral artery origin. Within this limitation, findings are as follows. 2. The common carotid and internal carotid arteries are patent within the neck without hemodynamically significant stenosis. Atherosclerotic plaque bilaterally (left greater than right), as described. 3. Vertebral arteries patent within the neck without stenosis or significant disc disease. 4. Aortic Atherosclerosis (ICD10-I70.0). 5. Cervical spondylosis. CTA head No proximal intracranial large vessel occlusion or high-grade proximal arterial stenosis identified MRI brain Punctate focus of acute/early subacute ischemia at the posterior left frontal operculum. 2. Early confluent T2 hyperintense signal within the cerebral white matter, most commonly due to chronic small vessel disease. 3. Generalized volume loss CXR No acute process.   Significant Labs:   Significant Imaging Studies: MRI lumbar spine Chronic lumbar spine degeneration. Chronic but progressed and severe multifactorial spinal, lateral recess, and left foraminal stenosis at L4-L5. Query left L4 and/or bilateral L5 radiculitis. 2. Stable Moderate multifactorial spinal stenosis at L3-L4 since 2019. But moderate to severe Left foraminal stenosis there appears  progressed. Query left L3 radiculitis. 3. Stable mild spinal stenosis at L2-L3.  Echo  LVEF 40-45%, no atrial level shunt  Antibiotic Therapy: Anti-infectives (From admission, onward)    None        Procedures:   Consultants: neurology   Hospital Course by Problem: * Stroke (HCC) 01-13-2024 echo negative for shunt. LVEF 40-45%. On ASA, plavix  for 3 weeks, then asa alone. On lipitor 40 mg daily. Add lisinopril  10 mg daily.  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  Housing instability 01-13-2024 pt states [place] just [place]  after spending several years living in [place]. He does not have a job and is living with some friends.  Degenerative joint disease (DJD) of lumbar spine 01-13-2024 MRI shows DJD and nerve impingement. Ambulatory referral to neurosurgery.  Lumbar radiculopathy 01-13-2024 MRI shows DJD and nerve impingement. Ambulatory referral to neurosurgery.  B12 deficiency 01-13-2024 start B12 1000 mcg daily.  Essential hypertension 01-13-2024 start lisinopril  10 mg daily.  DM2 (diabetes mellitus, type 2) (HCC) 01-13-2024 A1c 6.4%. he needs to f/u with PCP.    Discharge Diagnoses:  Principal Problem:   Stroke [facility]) Active Problems:   Ataxia   DM2 (diabetes mellitus, type 2) (HCC)   Essential hypertension   B12 deficiency   Lumbar radiculopathy   Degenerative joint disease (DJD) of lumbar spine   Housing instability   Discharge Instructions  Discharge Instructions     Ambulatory referral to Neurology   Complete by: As directed    Follow up any provider at [facility] in about 4-6 weeks. Thanks.   Ambulatory referral to Neurosurgery   Complete by: As directed    Please Select To Department: [facility] for Nerve or Spine  Please select To Department: [facility] AT [facility] for Cranial or Neurovascular   Ambulatory referral to Physical Therapy   Complete by: As directed    PT/OT eval and treat for stroke, debility, weakness, lumbar radiculopathy   Iontophoresis - 4 mg/ml of dexamethasone: No   T.E.N.S. Unit Evaluation and Dispense as Indicated: No   Call MD for:  difficulty breathing, headache or visual disturbances   Complete by: As  directed    Call MD for:  extreme fatigue   Complete by: As directed    Call MD for:  hives   Complete by: As directed    Call MD for:  persistant dizziness or light-headedness   Complete by: As directed    Call MD for:  persistant nausea and vomiting   Complete by: As directed    Call MD for:  redness, tenderness, or signs of infection (pain, swelling, redness, odor or green/yellow discharge around incision site)   Complete by: As directed    Call MD for:  severe uncontrolled pain   Complete by: As directed    Call MD for:  temperature >100.4   Complete by: As directed    Diet - low sodium heart healthy   Complete by: As directed    Diet Carb Modified   Complete by: As directed    Discharge instructions   Complete by: As directed    1. Follow up with your primary care provider in 1-2 weeks following discharge from hospital. 2. Outpatient neurology appointment will be made for you.   Increase activity slowly   Complete by: As directed       Allergies as of 01/13/2024   No Known Allergies      Medication List     TAKE these medications    aspirin  EC 81 MG tablet Take 1 tablet (81 mg total) by mouth daily. Swallow whole. Start taking on: January 14, 2024  atorvastatin  40 MG tablet Commonly known as: LIPITOR Take 1 tablet (40 mg total) by mouth daily.   clopidogrel  75 MG tablet Commonly known as: PLAVIX  Take 1 tablet (75 mg total) by mouth daily for 21 days. Start taking on: January 14, 2024   cyanocobalamin  1000 MCG tablet Take 1 tablet (1,000 mcg total) by mouth daily. Start taking on: January 14, 2024   lisinopril  10 MG tablet Commonly known as: ZESTRIL  Take 1 tablet (10 mg total) by mouth daily.               Durable Medical Equipment  (From admission, onward)           Start     Ordered   01/13/24 1330  For home use only DME Walker rolling  Once       Question Answer Comment  Walker: With 5 Inch Wheels   Patient needs a walker to treat with the  following condition Stroke (HCC)      01/13/24 1329            Follow-up Information     [facility]. Schedule an appointment as soon as possible for a visit in 1 month(s).   Contact information: [address]     [address] [place]  [address] [phone]        Lennard Lesta FALCON, MD Follow up.   Specialty: Gastroenterology Contact information: [address] [place] [address] [phone]                No Known Allergies  Discharge Exam: Vitals:   01/13/24 1153 01/13/24 1345  BP: (!) 156/91 (!) 146/96  Pulse: 81 91  Resp: 16 20  Temp: 98 F (36.7 C)   SpO2: 100% 100%    Physical Exam Vitals and nursing note reviewed.  Constitutional:      General: He is not in acute distress.    Appearance: He is normal weight. He is not toxic-appearing or diaphoretic.  HENT:     Nose: Nose normal.  Eyes:     General: No scleral icterus. Cardiovascular:     Rate and Rhythm: Normal rate and regular rhythm.  Pulmonary:     Effort: Pulmonary effort is normal. No respiratory distress.  Abdominal:     General: Abdomen is flat. There is no distension.  Musculoskeletal:     Right lower leg: No edema.     Left lower leg: No edema.  Neurological:     General: No focal deficit present.     Mental Status: He is alert and oriented to person, place, and time.     The results of significant diagnostics from this hospitalization (including imaging, microbiology, ancillary and laboratory) are listed below for reference.     Labs:  Basic Metabolic Panel: Recent Labs  Lab 01/12/24 1212 01/13/24 0221 01/13/24 0500  NA 136  --  134*  K 4.6  --  3.9  CL 100  --  100  CO2 24  --  24  GLUCOSE 147*  --  183*  BUN 16  --  10  CREATININE 1.09  --  0.84  CALCIUM  9.5  --  8.9  MG  --  2.3  --   PHOS  --  3.3  --    Liver Function Tests: Recent Labs  Lab 01/12/24 1212 01/13/24 0500  AST 26 26  ALT 14 12   ALKPHOS 69 65  BILITOT 1.7* 1.1  PROT 8.3*  7.4  ALBUMIN 4.0 3.7   CBC: Recent Labs  Lab 01/12/24 1212 01/13/24 0500  WBC 4.2 3.7*  HGB 13.5 13.5  HCT 40.9 40.0  MCV 89.3 88.1  PLT 408* 351   Cardiac Enzymes: Recent Labs  Lab 01/13/24 0221  CKTOTAL 343   CBG: Recent Labs  Lab 01/12/24 2313 01/13/24 0102 01/13/24 0309 01/13/24 0743 01/13/24 1128  GLUCAP 277* 161* 101* 207* 114*   Hgb A1c Recent Labs    01/12/24 1212  HGBA1C 6.4*   Lipid Profile Recent Labs    01/13/24 0500  CHOL 182  HDL 30*  LDLCALC 126*  TRIG 132  CHOLHDL 6.1   Thyroid  function studies Recent Labs    01/12/24 1410 01/13/24 0221  TSH 0.879  --   FREET4  --  0.87   Anemia work up Recent Labs    01/13/24 0221  VITAMINB12 176*  FOLATE 10.5  FERRITIN 221  TIBC 291  IRON 75  RETICCTPCT 1.9   Urinalysis    Component Value Date/Time   COLORURINE YELLOW 01/12/2024 1246   APPEARANCEUR CLEAR 01/12/2024 1246   LABSPEC 1.026 01/12/2024 1246   PHURINE 5.0 01/12/2024 1246   GLUCOSEU NEGATIVE 01/12/2024 1246   HGBUR NEGATIVE 01/12/2024 1246   BILIRUBINUR NEGATIVE 01/12/2024 1246   KETONESUR 5 (A) 01/12/2024 1246   PROTEINUR 30 (A) 01/12/2024 1246   NITRITE NEGATIVE 01/12/2024 1246   LEUKOCYTESUR NEGATIVE 01/12/2024 1246   Sepsis Labs Recent Labs  Lab 01/12/24 1212 01/13/24 0500  WBC 4.2 3.7*    Procedures/Studies: MR LUMBAR SPINE W WO CONTRAST Result Date: 01/13/2024 CLINICAL DATA:  63 year old male with lumbar radiculopathy. EXAM: MRI LUMBAR SPINE WITHOUT AND WITH CONTRAST TECHNIQUE: Multiplanar and multiecho pulse sequences of the lumbar spine were obtained without and with intravenous contrast. CONTRAST:  7mL GADAVIST  GADOBUTROL  1 MMOL/ML IV SOLN COMPARISON:  Lumbar MRI 10/24/2017.  Lumbar radiographs 11/12/2017. FINDINGS: Segmentation: Normal on the comparison radiographs, the same numbering system used on the 2019 MRI. Alignment: Chronic straightening of lumbar  lordosis. Subtle anterolisthesis of L4 on L5. Mild underlying dextroconvex lumbar scoliosis suspected. Vertebrae: Normal background bone marrow signal. Stable, maintained vertebral height since 2019. Chronic but progressed degenerative endplate marrow signal changes at L4-L5 asymmetric to the left. Patchy marrow edema there. Underlying vacuum disc at that level. See additional details of that level below. Bulky anterior endplate osteophytosis at L1-L2. But no marrow edema or acute osseous abnormality. Intact visible sacrum and SI joints. Conus medullaris and cauda equina: Conus extends to the L1-L2 level. Probable vascular related enhancement seen along the conus medullaris. No convincing abnormal intradural enhancement. No suspicious dural thickening. Paraspinal and other soft tissues: Negative. Benign left renal midpole cyst (no follow-up imaging recommended). Disc levels: T12-L1:  Negative. L1-L2:  Bulky anterior disc osteophyte degeneration.  No stenosis. L2-L3: Circumferential disc bulging. Mild facet and ligament flavum hypertrophy. Mild multifactorial spinal stenosis, in part related to epidural lipomatosis. Mild left and moderate right L2 foraminal stenosis. This level is stable since 2019. L3-L4: Moderate multifactorial spinal stenosis with circumferential disc bulge, endplate spurring, up to moderate facet and ligament flavum hypertrophy, mild epidural lipomatosis. Symmetric lateral recess involvement. These findings are stable. But moderate to severe left L3 neural foraminal stenosis appears progressed since 2019 on series 5, image 11. Mild right foraminal stenosis. L4-L5: Chronic but progressed and severe multifactorial spinal and bilateral lateral recess stenosis (series 8, image 31, descending L5 nerve levels). Vacuum disc and disc space loss. Bulky circumferential disc osteophyte  complex asymmetric to the left. Severe ligament flavum, moderate to severe facet hypertrophy greater on the left. Severe  left L4 neural foraminal stenosis also appears progressed. Only mild right L4 foraminal stenosis. L5-S1: Mild disc bulging and endplate spurring. Mild to moderate facet hypertrophy. No spinal or convincing lateral recess stenosis. Mild to moderate bilateral L5 foraminal stenosis has not significantly changed. IMPRESSION: 1. Chronic lumbar spine degeneration. Chronic but progressed and severe multifactorial spinal, lateral recess, and left foraminal stenosis at L4-L5. Query left L4 and/or bilateral L5 radiculitis. 2. Stable Moderate multifactorial spinal stenosis at L3-L4 since 2019. But moderate to severe Left foraminal stenosis there appears progressed. Query left L3 radiculitis. 3. Stable mild spinal stenosis at L2-L3. Electronically Signed   By: VEAR Hurst M.D.   On: 01/13/2024 12:48   ECHOCARDIOGRAM COMPLETE Result Date: 01/13/2024    ECHOCARDIOGRAM REPORT   Patient Name:   CHONG WOJDYLA [facility] Date of Exam: 01/13/2024 Medical Rec #:  [id number]      Height:       72.0 in Accession #:    [id number]     Weight:       150.0 lb Date of Birth:  [birth year]      BSA:          1.885 m Patient Age:    63 years       BP:           120/93 mmHg Patient Gender: M              HR:           81 bpm. Exam Location:  Inpatient Procedure: 2D Echo, Cardiac Doppler and Color Doppler (Both Spectral and Color            Flow Doppler were utilized during procedure). Indications:    Stroke I63.9  History:        Patient has no prior history of Echocardiogram examinations.                 Stroke; Risk Factors:Hypertension and Diabetes.  Sonographer:    Thea Norlander RCS Referring Phys: [id number] ANASTASSIA DOUTOVA IMPRESSIONS  1. Left ventricular ejection fraction, by estimation, is 40 to 45%. The left ventricle has mildly decreased function. The left ventricle demonstrates global hypokinesis. Left ventricular diastolic parameters are consistent with Grade I diastolic dysfunction (impaired relaxation).  2. Right ventricular systolic function is  normal. The right ventricular size is normal.  3. The mitral valve is normal in structure. Trivial mitral valve regurgitation. No evidence of mitral stenosis.  4. The aortic valve is tricuspid. Aortic valve regurgitation is mild. No aortic stenosis is present.  5. Aortic dilatation noted. There is mild dilatation of the aortic root, measuring 44 mm.  6. The inferior vena cava is normal in size with greater than 50% respiratory variability, suggesting right atrial pressure of 3 mmHg. FINDINGS  Left Ventricle: Left ventricular ejection fraction, by estimation, is 40 to 45%. The left ventricle has mildly decreased function. The left ventricle demonstrates global hypokinesis. The left ventricular internal cavity size was normal in size. There is  no left ventricular hypertrophy. Left ventricular diastolic parameters are consistent with Grade I diastolic dysfunction (impaired relaxation). Right Ventricle: The right ventricular size is normal. No increase in right ventricular wall thickness. Right ventricular systolic function is normal. Left Atrium: Left atrial size was normal in size. Right Atrium: Right atrial size was normal in size. Prominent Eustachian valve. Pericardium: There is no evidence of pericardial  effusion. Mitral Valve: The mitral valve is normal in structure. There is mild thickening of the anterior and posterior mitral valve leaflet(s). Trivial mitral valve regurgitation. No evidence of mitral valve stenosis. Tricuspid Valve: The tricuspid valve is normal in structure. Tricuspid valve regurgitation is not demonstrated. No evidence of tricuspid stenosis. Aortic Valve: The aortic valve is tricuspid. Aortic valve regurgitation is mild. No aortic stenosis is present. Aortic valve peak gradient measures 3.1 mmHg. Pulmonic Valve: The pulmonic valve was normal in structure. Pulmonic valve regurgitation is not visualized. No evidence of pulmonic stenosis. Aorta: The aortic root is normal in size and structure  and aortic dilatation noted. There is mild dilatation of the aortic root, measuring 44 mm. Venous: The inferior vena cava is normal in size with greater than 50% respiratory variability, suggesting right atrial pressure of 3 mmHg. IAS/Shunts: No atrial level shunt detected by color flow Doppler.  LEFT VENTRICLE PLAX 2D LVIDd:         3.40 cm     Diastology LVIDs:         2.50 cm     LV e' medial:    5.00 cm/s LV PW:         1.20 cm     LV E/e' medial:  9.9 LV IVS:        1.10 cm     LV e' lateral:   8.92 cm/s LVOT diam:     2.30 cm     LV E/e' lateral: 5.5 LV SV:         46 LV SV Index:   24 LVOT Area:     4.15 cm  LV Volumes (MOD) LV vol d, MOD A4C: 90.5 ml LV vol s, MOD A4C: 51.0 ml LV SV MOD A4C:     39.5 ml RIGHT VENTRICLE             IVC RV S prime:     11.60 cm/s  IVC diam: 1.30 cm TAPSE (M-mode): 1.6 cm LEFT ATRIUM             Index        RIGHT ATRIUM           Index LA diam:        2.30 cm 1.22 cm/m   RA Area:     13.00 cm LA Vol (A2C):   23.4 ml 12.41 ml/m  RA Volume:   30.30 ml  16.07 ml/m LA Vol (A4C):   15.9 ml 8.43 ml/m LA Biplane Vol: 20.9 ml 11.09 ml/m  AORTIC VALVE AV Area (Vmax): 3.17 cm AV Vmax:        87.85 cm/s AV Peak Grad:   3.1 mmHg LVOT Vmax:      67.10 cm/s LVOT Vmean:     45.600 cm/s LVOT VTI:       0.110 m  AORTA Ao Root diam: 4.40 cm Ao Asc diam:  3.90 cm MITRAL VALVE MV Area (PHT): 2.76 cm    SHUNTS MV Decel Time: 275 msec    Systemic VTI:  0.11 m MV E velocity: 49.30 cm/s  Systemic Diam: 2.30 cm MV A velocity: 69.70 cm/s MV E/A ratio:  0.71 Mihai Croitoru MD Electronically signed by Jerel Balding MD Signature Date/Time: 01/13/2024/11:30:45 AM    Final    DG CHEST PORT 1 VIEW Result Date: 01/12/2024 EXAM: 1 VIEW XRAY OF THE CHEST 01/12/2024 10:27:06 PM COMPARISON: 01/27/2018 CLINICAL HISTORY: [id number] Stroke (HCC) [id number]. Stroke [facility]) [id number] FINDINGS: LUNGS AND PLEURA: No  focal pulmonary opacity. No pulmonary edema. No pleural effusion. No pneumothorax. HEART AND MEDIASTINUM:  No acute abnormality of the cardiac and mediastinal silhouettes. BONES AND SOFT TISSUES: No acute osseous abnormality. IMPRESSION: 1. No acute process. Electronically signed by: Pinkie Pebbles MD 01/12/2024 10:29 PM EDT RP Workstation: [id number]   MR Brain W and Wo Contrast Result Date: 01/12/2024 EXAM: MRI BRAIN WITH AND WITHOUT CONTRAST 01/12/2024 08:30:54 PM TECHNIQUE: Multiplanar multisequence MRI of the head/brain was performed with and without the administration of intravenous contrast. COMPARISON: 05/14/2015 CLINICAL HISTORY: Neuro deficit, acute, stroke suspected; History of prostate cancer, [place] without medical care. Now left-sided weakness and unsteadiness with multiple falls. FINDINGS: BRAIN AND VENTRICLES: Punctate focus of acute/early subacute ischemia at the posterior left frontal operculum. Generalized volume loss. Early confluent hyperintense T2-weighted signal within the cerebral white matter, most commonly due to chronic small vessel disease. No acute intracranial hemorrhage. No mass effect or midline shift. No hydrocephalus. The sella is unremarkable. Normal flow voids. No mass or abnormal enhancement. ORBITS: Ocular lens replacements. No acute abnormality. SINUSES: Left maxillary sinus retention cysts. No acute abnormality. BONES AND SOFT TISSUES: Normal bone marrow signal and enhancement. No acute soft tissue abnormality. IMPRESSION: 1. Punctate focus of acute/early subacute ischemia at the posterior left frontal operculum. 2. Early confluent T2 hyperintense signal within the cerebral white matter, most commonly due to chronic small vessel disease. 3. Generalized volume loss. Electronically signed by: Franky Stanford MD 01/12/2024 09:06 PM EDT RP Workstation: [id number]   CT ANGIO HEAD NECK W WO CM Result Date: 01/12/2024 CLINICAL DATA:  Provided history: Neuro deficit, acute, stroke suspected. EXAM: CT ANGIOGRAPHY HEAD AND NECK WITH AND WITHOUT  CONTRAST TECHNIQUE: Multidetector CT imaging of the head and neck was performed using the standard protocol during bolus administration of intravenous contrast. Multiplanar CT image reconstructions and MIPs were obtained to evaluate the vascular anatomy. Carotid stenosis measurements (when applicable) are obtained utilizing NASCET criteria, using the distal internal carotid diameter as the denominator. RADIATION DOSE REDUCTION: This exam was performed according to the departmental dose-optimization program which includes automated exposure control, adjustment of the mA and/or kV according to patient size and/or use of iterative reconstruction technique. CONTRAST:  75mL OMNIPAQUE  IOHEXOL  350 MG/ML SOLN COMPARISON:  Brain MRI 05/14/2015. FINDINGS: CT HEAD FINDINGS Brain: Mild-to-moderate prominence of the lateral third ventricles, slightly progressed from the prior MRI of 05/14/2015. This is at least partly due to cerebral atrophy. However, there also imaging features which can be seen in the setting of normal pressure hydrocephalus (crowding of the sulci at the level of the high frontoparietal lobes, an acute callosal angle and prominence of the sylvian fissures), and a component of NPH is possible in the appropriate clinical setting. Patchy and ill-defined hypoattenuation within the cerebral white matter, nonspecific but compatible with mild-to-moderate chronic small vessel ischemic disease. There is no acute intracranial hemorrhage. No demarcated cortical infarct. No extra-axial fluid collection. No evidence of an intracranial mass. No midline shift. Vascular: No hyperdense vessel. Skull: No mass or acute finding within the imaged orbits. Sinuses/Orbits: No orbital mass or acute orbital finding. Mucous retention cysts within the left maxillary sinus measuring up to 17 mm. Review of the MIP images confirms the above findings CTA NECK FINDINGS Aortic arch: Standard aortic branching. Atherosclerotic plaque within the  visualized aortic arch. Streak/beam hardening artifact arising from a dense contrast bolus partially obscures the left subclavian artery. Within this limitation, there is no appreciable  hemodynamically significant innominate or proximal subclavian artery stenosis. Right carotid system: CCA and ICA patent within the neck without stenosis or significant atherosclerotic disease. Tortuosity of the cervical ICA. Left carotid system: Streak/beam hardening artifact partially obscures the proximal common carotid artery. Within this limitation, the common carotid and internal carotid arteries are patent within the neck without hemodynamically significant stenosis (50% or greater). Minimal nonstenotic atherosclerotic plaque within the CCA. Mild-to-moderate atherosclerotic plaque within the proximal ICA. Vertebral arteries: Venous reflux of contrast limits evaluation of the left vertebral artery origin. Within this limitation, the vertebral arteries are codominant and patent within the neck without stenosis or significant atherosclerotic disease. Skeleton: Levocurvature of the cervical spine. Dextrocurvature of the upper thoracic spine, partially imaged. Cervical spondylosis. No acute fracture or aggressive osseous lesion. Other neck: No neck mass or cervical lymphadenopathy. Upper chest: No consolidation within the imaged lung apices. Review of the MIP images confirms the above findings CTA HEAD FINDINGS Anterior circulation: The intracranial internal carotid arteries are patent. The M1 middle cerebral arteries are patent. No M2 proximal branch occlusion or high-grade proximal stenosis. The anterior cerebral arteries are patent. Mildly hypoplastic left A1 segment. No intracranial aneurysm is identified. Posterior circulation: The intracranial vertebral arteries are patent. The basilar artery is patent. The posterior cerebral arteries are patent. Atherosclerotic irregularity of both vessels without high-grade proximal  stenosis. Hypoplastic left P1 segment with sizable left posterior communicating artery. A right posterior communicating artery is also present. Venous sinuses: Evaluation for no venous sinus thrombosis is limited due to contrast timing. Anatomic variants: As described. Review of the MIP images confirms the above findings IMPRESSION: Non-contrast head CT: 1.  No evidence of an acute intracranial abnormality. 2. Mild-to-moderate cerebral white matter chronic small vessel ischemic disease. 3. Mild-to-moderate prominence of the lateral third ventricles, slightly progressed from the prior MRI of 05/14/2015. This is at least partly due to cerebral atrophy. However, there also imaging features which can be seen in the setting of normal pressure hydrocephalus, and a component of NPH is possible in the appropriate clinical setting. 4. Mucous retention cysts within the left maxillary sinus (measuring up to 17 mm). CTA neck: 1. Artifact partially obscures the proximal left common carotid artery and obscures the left vertebral artery origin. Within this limitation, findings are as follows. 2. The common carotid and internal carotid arteries are patent within the neck without hemodynamically significant stenosis. Atherosclerotic plaque bilaterally (left greater than right), as described. 3. Vertebral arteries patent within the neck without stenosis or significant disc disease. 4. Aortic Atherosclerosis (ICD10-I70.0). 5. Cervical spondylosis. CTA head: No proximal intracranial large vessel occlusion or high-grade proximal arterial stenosis identified. Electronically Signed   By: Rockey Childs D.O.   On: 01/12/2024 19:01    Time coordinating discharge: 50 mins  SIGNED:  Camellia Door, DO [facility] 01/13/24, 2:16 PM

## 2024-01-13 NOTE — Hospital Course (Addendum)
 HPI: Douglas Stewart is a 63 y.o. male with medical history significant of prostate cancer, HTN   DM2   Presented with  falls, left side weakness Patient coming in complaining of frequent falls Denies any chest pain no nausea no vomiting no diarrhea.   Has past history of prostate cancer but has not been followed up for past few years He did endorse that his left arm and leg felt weak He feels occasionally like the room was spinning occasional blurry vision no speech issues no pain Found to have subtotal left facial droop and mild left side weakness CTA showed no CVA but slightly prominent ventricles MRI showed small acute early subacute ischemia in the left frontal area  Significant Events: Admitted 01/12/2024 for acute CVA   Admission Labs: WBC 4.2, HgB 13.5, plt 408 Na 136, K 4.6, CO2 of 24, BUN 16, scr 1.09, glu 147 T prot 8.3, alb 4.0, AST 26, ALT 14, alk phos 69. T. Bili 1.7 UA negative nitrite, negative LE. Ketones 5, protein 30. Otherwise negative A1c 6.4% TSH 0.879 Fe 75, TIBC 291, %sat 26 ESR 51, crp 1.0, ferritin 221 Folate 10.5 Vit B12 176  Admission Imaging Studies: CT head No evidence of an acute intracranial abnormality. 2. Mild-to-moderate cerebral white matter chronic small vessel ischemic disease. 3. Mild-to-moderate prominence of the lateral third ventricles, slightly progressed from the prior MRI of 05/14/2015. This is at least partly due to cerebral atrophy. However, there also imaging features which can be seen in the setting of normal pressure hydrocephalus, and a component of NPH is possible in the appropriate clinical setting. 4. Mucous retention cysts within the left maxillary sinus (measuring up to 17 mm)  CTA neck Artifact partially obscures the proximal left common carotid artery and obscures the left vertebral artery origin. Within this limitation, findings are as follows. 2. The common carotid and internal carotid arteries are patent within the neck  without hemodynamically significant stenosis. Atherosclerotic plaque bilaterally (left greater than right), as described. 3. Vertebral arteries patent within the neck without stenosis or significant disc disease. 4. Aortic Atherosclerosis (ICD10-I70.0). 5. Cervical spondylosis. CTA head No proximal intracranial large vessel occlusion or high-grade proximal arterial stenosis identified MRI brain Punctate focus of acute/early subacute ischemia at the posterior left frontal operculum. 2. Early confluent T2 hyperintense signal within the cerebral white matter, most commonly due to chronic small vessel disease. 3. Generalized volume loss CXR No acute process.   Significant Labs:   Significant Imaging Studies: MRI lumbar spine Chronic lumbar spine degeneration. Chronic but progressed and severe multifactorial spinal, lateral recess, and left foraminal stenosis at L4-L5. Query left L4 and/or bilateral L5 radiculitis. 2. Stable Moderate multifactorial spinal stenosis at L3-L4 since 2019. But moderate to severe Left foraminal stenosis there appears  progressed. Query left L3 radiculitis. 3. Stable mild spinal stenosis at L2-L3.  Echo  LVEF 40-45%, no atrial level shunt  Antibiotic Therapy: Anti-infectives (From admission, onward)    None       Procedures:   Consultants: neurology

## 2024-01-13 NOTE — Evaluation (Addendum)
 Physical Therapy Evaluation Patient Details Name: Douglas Stewart MRN: [id number] DOB: 08-12-1960 Today's Date: 01/13/2024  History of Present Illness  63 y.o. male presented 01/12/24 with falls and left sided weakness. 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 PTA patient lived with a friend in a one level apartment with 4 steps to enter with rail. He reports frequent falls, but not daily. Reports he gets off-balance, including sometimes backwards. Pt currently required CGA to ambulate without a device with imbalance noted. Educated in use of RW and noted pt's gait more normalized. Educated pt on use of UE sensation via RW to compensate for what his feet can no longer feel. He is happy to use and RW. Patient had once instance of tripping over his left foot due to foot drop. May benefit from foot-up type brace. Will defer to OPPT as pt is eager to participate. Anticipate patient will benefit from PT to address problems listed below. Will continue to follow acutely to maximize functional mobility, independence, and safety.           If plan is discharge home, recommend the following: Assistance with cooking/housework;Assist for transportation;Help with stairs or ramp for entrance   Can travel by private vehicle        Equipment Recommendations Rolling walker (2 wheels)  Recommendations for Other Services  OT consult    Functional Status Assessment Patient has had a recent decline in their functional status and demonstrates the ability to make significant improvements in function in a reasonable and predictable amount of time.     Precautions / Restrictions Precautions Precautions: Fall Recall of Precautions/Restrictions:  Intact Precaution/Restrictions Comments: reports many falls in the past year      Mobility  Bed Mobility Overal bed mobility: Independent                  Transfers Overall transfer level: Needs assistance Equipment used: Rolling walker (2 wheels), None Transfers: Sit to/from Stand Sit to Stand: Contact guard assist           General transfer comment: guarding for safety; no imbalance without device; vc for sequencing with RW    Ambulation/Gait Ambulation/Gait assistance: Contact guard assist Gait Distance (Feet): 100 Feet (no device; 100 RW) Assistive device: Rolling walker (2 wheels), None Gait Pattern/deviations: Step-through pattern, Decreased stride length, Decreased dorsiflexion - left, Drifts right/left   Gait velocity interpretation: 1.31 - 2.62 ft/sec, indicative of limited community ambulator   General Gait Details: gait deviations above without RW, imbalance due to tripping over Left foot due to poor DF; with RW able to maintain straight path, improved velocity; vc for technique with RW  Stairs Stairs:  (seen in ED--no stairs)          Wheelchair Mobility     Tilt Bed    Modified Rankin (Stroke Patients Only)       Balance                                 Standardized Balance Assessment Standardized Balance Assessment : Berg Balance Test, Dynamic Gait Index Berg Balance Test Sit to Stand: Able to stand  independently using hands Standing Unsupported: Able to stand safely 2 minutes  Sitting with Back Unsupported but Feet Supported on Floor or Stool: Able to sit safely and securely 2 minutes Stand to Sit: Sits safely with minimal use of hands Transfers: Able to transfer safely, minor use of hands Standing Unsupported with Eyes Closed: Able to stand 10 seconds with supervision Standing Ubsupported with Feet Together: Able to place feet together independently and stand for 1 minute with supervision From Standing, Reach Forward  with Outstretched Arm: Can reach forward >12 cm safely (5) From Standing Position, Pick up Object from Floor: Able to pick up shoe, needs supervision From Standing Position, Turn to Look Behind Over each Shoulder: Turn sideways only but maintains balance Turn 360 Degrees: Needs close supervision or verbal cueing Standing Unsupported, Alternately Place Feet on Step/Stool: Needs assistance to keep from falling or unable to try Standing Unsupported, One Foot in Front: Able to take small step independently and hold 30 seconds Standing on One Leg: Tries to lift leg/unable to hold 3 seconds but remains standing independently Total Score: 37 Dynamic Gait Index Level Surface: Moderate Impairment Change in Gait Speed: Severe Impairment Gait with Horizontal Head Turns: Moderate Impairment Gait with Vertical Head Turns: Moderate Impairment Gait and Pivot Turn: Mild Impairment Step Over Obstacle: Moderate Impairment Step Around Obstacles: Mild Impairment Steps: Mild Impairment (clinical judgement (in ED)) Total Score: 10       Pertinent Vitals/Pain Pain Assessment Pain Assessment: No/denies pain    Home Living Family/patient expects to be discharged to:: Private residence Living Arrangements: Non-relatives/Friends Available Help at Discharge: Friend(s);Available PRN/intermittently Type of Home: Apartment Home Access: Stairs to enter Entrance Stairs-Rails: Right Entrance Stairs-Number of Steps: 4   Home Layout: One level   Additional Comments: has been staying with friend    Prior Function Prior Level of Function : Independent/Modified Independent             Mobility Comments: no device; frequent falls due to imbalance (denies buckling or dizziness)       Extremity/Trunk Assessment   Upper Extremity Assessment Upper Extremity Assessment: Defer to OT evaluation    Lower Extremity Assessment Lower Extremity Assessment: RLE deficits/detail;LLE deficits/detail RLE Deficits /  Details: knee extension 4+, ankle DF 4 RLE Sensation: history of peripheral neuropathy (up to just below his knee) LLE Deficits / Details: knee extension 3+; ankle DF 3+ LLE Sensation: history of peripheral neuropathy (up to just below knee)    Cervical / Trunk Assessment Cervical / Trunk Assessment: Normal  Communication   Communication Communication: Other (comment) Factors Affecting Communication: Other (comment) (English not primary language; moderate accent can make it difficult to understand)    Cognition Arousal: Alert Behavior During Therapy: WFL for tasks assessed/performed   PT - Cognitive impairments: No apparent impairments                         Following commands: Intact       Cueing Cueing Techniques: Verbal cues     General Comments      Exercises     Assessment/Plan    PT Assessment Patient needs continued PT services  PT Problem List Decreased strength;Decreased balance;Decreased mobility;Decreased knowledge of use of DME;Impaired sensation       PT Treatment Interventions DME instruction;Gait training;Stair training;Functional mobility training;Therapeutic activities;Therapeutic exercise;Balance training;Patient/family education    PT Goals (Current goals can be found in the Care Plan section)  Acute Rehab PT Goals Patient Stated Goal: stop falling PT Goal Formulation: With patient Time For Goal Achievement:  01/27/24 Potential to Achieve Goals: Good    Frequency Min 2X/week     Co-evaluation               AM-PAC PT 6 Clicks Mobility  Outcome Measure Help needed turning from your back to your side while in a flat bed without using bedrails?: None Help needed moving from lying on your back to sitting on the side of a flat bed without using bedrails?: None Help needed moving to and from a bed to a chair (including a wheelchair)?: A Little Help needed standing up from a chair using your arms (e.g., wheelchair or bedside  chair)?: A Little Help needed to walk in hospital room?: A Little Help needed climbing 3-5 steps with a railing? : A Little 6 Click Score: 20    End of Session Equipment Utilized During Treatment: Gait belt Activity Tolerance: Patient tolerated treatment well Patient left: in bed;with call bell/phone within reach (ED stretcher)   PT Visit Diagnosis: Unsteadiness on feet (R26.81);Repeated falls (R29.6)    Time: 8751-8684 PT Time Calculation (min) (ACUTE ONLY): 27 min   Charges:   PT Evaluation $PT Eval Moderate Complexity: 1 Mod PT Treatments $Gait Training: 8-22 mins PT General Charges $$ ACUTE PT VISIT: 1 Visit          Macario RAMAN, PT Acute Rehabilitation Services  Office [phone]   [name] 01/13/2024, 1:48 PM

## 2024-01-13 NOTE — Progress Notes (Signed)
 PT Cancellation Note  Patient Details Name: Douglas Stewart MRN: [id number] DOB: Jun 15, 1961   Cancelled Treatment:    Reason Eval/Treat Not Completed: Patient at procedure or test/unavailable  Patient undergoing echo. Will continue attempts at PT eval.    Macario GORMAN, PT Acute Rehabilitation Services  Office [phone]  [name] 01/13/2024, 9:11 AM

## 2024-01-13 NOTE — Care Management (Addendum)
 Transition of Care [facility]) - Inpatient Brief Assessment   Patient Details  Name: Douglas Stewart MRN: [id number] Date of Birth: 1960/12/13  Transition of Care [facility]) CM/SW Contact:    Corean JAYSON Canary, RN Phone Number: 01/13/2024, 1:42 PM   Clinical Narrative:  Patient lives with friends, Recently come back to the [place] from [place] . He has not had follow up or seen doctors there. History of prostate ca.  He has gastroenterology listed as primary, but this was before he went out of the country  Asked and explained home health, he is unsure if he would want to proceed with that if recommended. PT  will see him sometime today, He had his ECHO and MRI, will need neurology and PCP follow up Referred to [facility] neuro for PT OT  Spoke with Dr Laurence, will add food resources, getting the patient a walker to be delivered to room. Patient will call his primary listed on monday TOC will follow for needs  Transition of Care Asessment: Insurance and Status: Insurance coverage has been reviewed Patient has primary care physician: Yes Home environment has been reviewed: Lives with friends recently from [place] back to states Prior level of function:: Independent Prior/Current Home Services: No current home services Social Drivers of Health Review: SDOH reviewed no interventions necessary Readmission risk has been reviewed: Yes Transition of care needs: transition of care needs identified, TOC will continue to follow

## 2024-01-13 NOTE — ED Notes (Signed)
 Iv placement unsuccessful, iv therapy consult placed

## 2024-01-13 NOTE — ED Notes (Signed)
 Pt back from MRI, pt sitting up in bed eating lunch

## 2024-01-13 NOTE — Care Management Obs Status (Signed)
 MEDICARE OBSERVATION STATUS NOTIFICATION   Patient Details  Name: Douglas Stewart MRN: [id number] Date of Birth: 09-26-1960   Medicare Observation Status Notification Given:  Yes    Corean JAYSON Canary, RN 01/13/2024, 1:40 PM

## 2024-01-13 NOTE — ED Notes (Signed)
 PT given walker and seen walking out of the ER with steady gait. Vitals not obtained due to pt leaving department.

## 2024-01-13 NOTE — Assessment & Plan Note (Signed)
 01-13-2024 due to CVA.

## 2024-01-13 NOTE — Evaluation (Signed)
 Occupational Therapy Evaluation Patient Details Name: Douglas Stewart MRN: [id number] DOB: [birth year] Today's Date: 01/13/2024   History of Present Illness   63 y.o. male presented 01/12/24 with falls and left sided weakness. 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 Upon eval, pt with LUE weakness, but predominantly limited by decr balance. Pt needing up to CGA for BADL. Unsure of pt cognitive baseline, but pt with STM deficits noted; pt also with mild language barrier? Pt may benefit from OP OT assessment for follow up. Pt reports food insecurity. Provided resources for meals on wheels.      If plan is discharge home, recommend the following:   A little help with walking and/or transfers;A little help with bathing/dressing/bathroom;Assistance with cooking/housework;Assist for transportation;Help with stairs or ramp for entrance     Functional Status Assessment   Patient has had a recent decline in their functional status and demonstrates the ability to make significant improvements in function in a reasonable and predictable amount of time.     Equipment Recommendations   BSC/3in1;Other (comment) (RW)     Recommendations for Other Services         Precautions/Restrictions   Precautions Precautions: Fall Recall of Precautions/Restrictions: Intact Precaution/Restrictions Comments: reports many falls in the past year     Mobility Bed Mobility Overal bed mobility: Independent                  Transfers Overall transfer level: Needs assistance Equipment used: Rolling walker (2 wheels), None Transfers: Sit to/from Stand Sit to Stand: Contact guard assist           General transfer  comment: guarding for safety; no imbalance without device; vc for sequencing with RW      Balance                                           ADL either performed or assessed with clinical judgement   ADL Overall ADL's : Needs assistance/impaired Eating/Feeding: Modified independent;Sitting   Grooming: Contact guard assist;Standing                   Toilet Transfer: Contact guard assist;Ambulation;Rolling walker (2 wheels)           Functional mobility during ADLs: Contact guard assist;Rolling walker (2 wheels)       Vision Patient Visual Report: No change from baseline Vision Assessment?: Vision impaired- to be further tested in functional context Additional Comments: pt able to read large fonts only     Perception         Praxis         Pertinent Vitals/Pain Pain Assessment Pain Assessment: 0-10 Pain Score: 0-No pain     Extremity/Trunk Assessment Upper Extremity Assessment Upper Extremity Assessment: LUE deficits/detail LUE Deficits / Details: Pt with 4/5 strength as compared to R 5/5   Lower Extremity Assessment Lower Extremity Assessment: Defer to PT evaluation   Cervical / Trunk Assessment Cervical / Trunk Assessment: Normal   Communication Communication Communication: Other (comment) Factors Affecting Communication: Other (comment) (English not primary language; moderate accent can make it difficult to understand)   Cognition Arousal:  Alert Behavior During Therapy: WFL for tasks assessed/performed Cognition: No apparent impairments             OT - Cognition Comments: some language barriers and STM difficulty but suspect pt at baseline                 Following commands: Intact       Cueing  General Comments   Cueing Techniques: Verbal cues  VSS   Exercises     Shoulder Instructions      Home Living Family/patient expects to be discharged to:: Private residence Living Arrangements:  Non-relatives/Friends Available Help at Discharge: Friend(s);Available PRN/intermittently Type of Home: Apartment Home Access: Stairs to enter Entrance Stairs-Number of Steps: 4 Entrance Stairs-Rails: Right Home Layout: One level                   Additional Comments: has been staying with friend  Lives With: Friend(s)    Prior Functioning/Environment Prior Level of Function : Independent/Modified Independent             Mobility Comments: no device; frequent falls due to imbalance (denies buckling or dizziness)      OT Problem List: Decreased strength;Decreased activity tolerance;Impaired balance (sitting and/or standing)   OT Treatment/Interventions: Self-care/ADL training;Therapeutic exercise;DME and/or AE instruction;Therapeutic activities;Visual/perceptual remediation/compensation;Patient/family education      OT Goals(Current goals can be found in the care plan section)   Acute Rehab OT Goals Patient Stated Goal: get better OT Goal Formulation: With patient Time For Goal Achievement: 01/27/24 Potential to Achieve Goals: Good   OT Frequency:  Min 2X/week    Co-evaluation              AM-PAC OT 6 Clicks Daily Activity     Outcome Measure Help from another person eating meals?: None Help from another person taking care of personal grooming?: A Little Help from another person toileting, which includes using toliet, bedpan, or urinal?: A Little Help from another person bathing (including washing, rinsing, drying)?: A Little Help from another person to put on and taking off regular upper body clothing?: A Little Help from another person to put on and taking off regular lower body clothing?: A Little 6 Click Score: 19   End of Session Equipment Utilized During Treatment: Gait belt;Rolling walker (2 wheels) Nurse Communication: Mobility status  Activity Tolerance: Patient tolerated treatment well Patient left: in bed;with call bell/phone within  reach  OT Visit Diagnosis: Unsteadiness on feet (R26.81);Muscle weakness (generalized) (M62.81)                Time: 1425-1440 OT Time Calculation (min): 15 min Charges:  OT General Charges $OT Visit: 1 Visit OT Evaluation $OT Eval Low Complexity: 1 Low  Elma JONETTA Lebron FREDERICK, OTR/L [facility] Acute Rehabilitation Office: [phone]   Elma JONETTA Lebron 01/13/2024, 5:49 PM

## 2024-01-13 NOTE — Assessment & Plan Note (Signed)
 01-13-2024 start B12 1000 mcg daily.

## 2024-01-13 NOTE — Progress Notes (Signed)
 Echocardiogram 2D Echocardiogram has been performed.  Thea Norlander 01/13/2024, 9:24 AM

## 2024-01-13 NOTE — Assessment & Plan Note (Signed)
 01-13-2024 start lisinopril  10 mg daily.

## 2024-01-14 ENCOUNTER — Other Ambulatory Visit (HOSPITAL_COMMUNITY): Payer: Self-pay

## 2024-01-14 LAB — T3: T3, Total: 83 ng/dL (ref 71–180)

## 2024-01-15 LAB — VITAMIN B6: Vitamin B6: 4.8 ug/L (ref 3.4–65.2)

## 2024-01-19 LAB — VITAMIN B1: Vitamin B1 (Thiamine): 61.1 nmol/L — ABNORMAL LOW (ref 66.5–200.0)

## 2024-01-28 ENCOUNTER — Other Ambulatory Visit (HOSPITAL_COMMUNITY): Payer: Self-pay

## 2024-03-18 ENCOUNTER — Ambulatory Visit: Attending: Physical Therapy | Admitting: Physical Therapy

## 2024-03-18 NOTE — Therapy (Incomplete)
 OUTPATIENT PHYSICAL THERAPY NEURO EVALUATION   Patient Name: Douglas Stewart MRN: [id number] DOB:September 20, 1960, 63 y.o., male Today's Date: 03/18/2024   PCP: Lennard Lesta FALCON, MD REFERRING PROVIDER: Laurence Locus, DO  END OF SESSION:   Past Medical History:  Diagnosis Date   Hypertension    Prostate cancer [facility])    Past Surgical History:  Procedure Laterality Date   no surgical history     Patient Active Problem List   Diagnosis Date Noted   B12 deficiency 01/13/2024   Lumbar radiculopathy 01/13/2024   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   Housing instability 01/13/2024   Stroke (HCC) 01/12/2024   DM2 (diabetes mellitus, type 2) (HCC) 01/12/2024   Essential hypertension 01/12/2024   Ataxia 05/03/2015   Hearing loss in left ear 05/03/2015   Sensation of fullness in left ear 05/03/2015    ONSET DATE: 01/13/2024 (referral)   REFERRING DIAG: R26.81 (ICD-10-CM) - Unsteadiness I63.9 (ICD-10-CM) - Cerebrovascular accident (CVA), unspecified mechanism (HCC) M47.896 (ICD-10-CM) - Other osteoarthritis of spine, lumbar region  THERAPY DIAG:  No diagnosis found.  Rationale for Evaluation and Treatment: Rehabilitation  SUBJECTIVE:                                                                                                                                                                                             SUBJECTIVE STATEMENT: *** Pt accompanied by: {accompnied:27141}  PERTINENT HISTORY: prostate cancer, HTN, DM2  PAIN:  Are you having pain? {OPRCPAIN:27236}  PRECAUTIONS: {Therapy precautions:24002}  RED FLAGS: {PT Red Flags:29287}   WEIGHT BEARING RESTRICTIONS: {Yes ***/No:24003}  FALLS: Has patient fallen in last 6 months? {fallsyesno:27318}  LIVING ENVIRONMENT: Lives with: {OPRC lives with:25569::lives with their family} Lives in: {Lives in:25570} Stairs: {opstairs:27293} Has following equipment at home: {Assistive  devices:23999}  PLOF: {PLOF:24004}  PATIENT GOALS: ***  OBJECTIVE:  Note: Objective measures were completed at Evaluation unless otherwise noted.  DIAGNOSTIC FINDINGS: MRI of brain from 01/12/24  IMPRESSION: 1. Punctate focus of acute/early subacute ischemia at the posterior left frontal operculum. 2. Early confluent T2 hyperintense signal within the cerebral white matter, most commonly due to chronic small vessel disease. 3. Generalized volume loss.  CTA of head/neck from 01/12/24 IMPRESSION: Non-contrast head CT:   1.  No evidence of an acute intracranial abnormality. 2. Mild-to-moderate cerebral white matter chronic small vessel ischemic disease. 3. Mild-to-moderate prominence of the lateral third ventricles, slightly progressed from the prior MRI of 05/14/2015. This is at least partly due to cerebral atrophy. However, there also imaging features which can be seen in the setting of normal pressure hydrocephalus, and a component of  NPH is possible in the appropriate clinical setting. 4. Mucous retention cysts within the left maxillary sinus (measuring up to 17 mm).  COGNITION: Overall cognitive status: {cognition:24006}   SENSATION: {sensation:27233}  COORDINATION: ***  EDEMA:  {edema:24020}  MUSCLE TONE: {LE tone:25568}  MUSCLE LENGTH: Hamstrings: Right *** deg; Left *** deg Debby test: Right *** deg; Left *** deg  DTRs:  {DTR SITE:24025}  POSTURE: {posture:25561}  LOWER EXTREMITY ROM:     {AROM/PROM:27142}  Right Eval Left Eval  Hip flexion    Hip extension    Hip abduction    Hip adduction    Hip internal rotation    Hip external rotation    Knee flexion    Knee extension    Ankle dorsiflexion    Ankle plantarflexion    Ankle inversion    Ankle eversion     (Blank rows = not tested)  LOWER EXTREMITY MMT:    MMT Right Eval Left Eval  Hip flexion    Hip extension    Hip abduction    Hip adduction    Hip internal rotation    Hip  external rotation    Knee flexion    Knee extension    Ankle dorsiflexion    Ankle plantarflexion    Ankle inversion    Ankle eversion    (Blank rows = not tested)  BED MOBILITY:  {bed mobility:32615:p}  TRANSFERS: {transfers eval:32620}  RAMP:  {ramp eval:32616}  CURB:  {curb eval:32617}  STAIRS: {stairs eval:32618} GAIT: Findings: {GaitneuroPT:32644::Distance walked: ***,Comments: ***}  FUNCTIONAL TESTS:  {Functional tests:24029}  PATIENT SURVEYS:  {rehab surveys:24030}                                                                                                                              TREATMENT DATE: ***    PATIENT EDUCATION: Education details: *** Person educated: {Person educated:25204} Education method: {Education Method:25205} Education comprehension: {Education Comprehension:25206}  HOME EXERCISE PROGRAM: ***  GOALS: Goals reviewed with patient? Yes  SHORT TERM GOALS: Target date: ***  *** Baseline: Goal status: INITIAL  2.  *** Baseline:  Goal status: INITIAL  3.  *** Baseline:  Goal status: INITIAL  4.  *** Baseline:  Goal status: INITIAL  5.  *** Baseline:  Goal status: INITIAL  6.  *** Baseline:  Goal status: INITIAL  LONG TERM GOALS: Target date: ***  *** Baseline:  Goal status: INITIAL  2.  *** Baseline:  Goal status: INITIAL  3.  *** Baseline:  Goal status: INITIAL  4.  *** Baseline:  Goal status: INITIAL  5.  *** Baseline:  Goal status: INITIAL  6.  *** Baseline:  Goal status: INITIAL  ASSESSMENT:  CLINICAL IMPRESSION: Patient is a 63 year old male referred to Neuro OPPT for CVA.   Pt's PMH is significant for: prostate cancer, HTN, DM2. The following deficits were present during the exam: ***. Based on ***, pt is an incr risk for  falls. Pt would benefit from skilled PT to address these impairments and functional limitations to maximize functional mobility independence   OBJECTIVE  IMPAIRMENTS: {opptimpairments:25111}.   ACTIVITY LIMITATIONS: {activitylimitations:27494}  PARTICIPATION LIMITATIONS: {participationrestrictions:25113}  PERSONAL FACTORS: {Personal factors:25162} are also affecting patient's functional outcome.   REHAB POTENTIAL: {rehabpotential:25112}  CLINICAL DECISION MAKING: {clinical decision making:25114}  EVALUATION COMPLEXITY: {Evaluation complexity:25115}  PLAN:  PT FREQUENCY: {rehab frequency:25116}  PT DURATION: {rehab duration:25117}  PLANNED INTERVENTIONS: {rehab planned interventions:25118::97110-Therapeutic exercises,97530- Therapeutic [phone]- Neuromuscular re-education,97535- Self Rjmz,02859- Manual therapy,Patient/Family education}  PLAN FOR NEXT SESSION: ***   Janiel Derhammer E Braun Rocca, PT, DPT 03/18/2024, 7:41 AM

## 2024-04-02 NOTE — Progress Notes (Deleted)
 Referring Physician:  Lennard Lesta FALCON, MD [phone] [address] [place],  [place] [address]  Primary Physician:  Lennard Lesta FALCON, MD  History of Present Illness: 04/02/2024 [name] is here today with a chief complaint of ***  Lumbar radiculopathy. Pain in waist. Pain on both sides of the buttocks. Numbness in the left foot.   Duration: *** Location: *** Quality: *** Severity: ***  Precipitating: aggravated by *** Modifying factors: made better by *** Weakness: none Timing: *** Bowel/Bladder Dysfunction: none  Conservative measures:  Physical therapy: *** Has not participated in? Multimodal medical therapy including regular antiinflammatories: ***  Injections: *** No epidural steroid injections?  Past Surgery: ***  Douglas Stewart has ***no symptoms of cervical myelopathy.  The symptoms are causing a significant impact on the patient's life.   Review of Systems:  A 10 point review of systems is negative, except for the pertinent positives and negatives detailed in the HPI.  Past Medical History: Past Medical History:  Diagnosis Date   Hypertension    Prostate cancer [facility])     Past Surgical History: Past Surgical History:  Procedure Laterality Date   no surgical history      Allergies: Allergies as of 04/08/2024   (No Known Allergies)    Medications: Outpatient Encounter Medications as of 04/08/2024  Medication Sig   aspirin  EC 81 MG tablet Take 1 tablet (81 mg total) by mouth daily. Swallow whole.   atorvastatin  (LIPITOR) 40 MG tablet Take 1 tablet (40 mg total) by mouth daily.   cyanocobalamin  1000 MCG tablet Take 1 tablet (1,000 mcg total) by mouth daily.   lisinopril  (ZESTRIL ) 10 MG tablet Take 1 tablet (10 mg total) by mouth daily.   No facility-administered encounter medications on file as of 04/08/2024.    Social History: Social History   Tobacco Use   Smoking status: Former    Current packs/day: 0.00    Types: Cigarettes     Quit date: 06/27/1999    Years since quitting: 24.7   Smokeless tobacco: Never  Substance Use Topics   Alcohol use: No    Alcohol/week: 0.0 standard drinks of alcohol   Drug use: No    Family Medical History: Family History  Problem Relation Age of Onset   Stroke Neg Hx     Physical Examination: @VITALWITHPAIN @  General: Patient is well developed, well nourished, calm, collected, and in no apparent distress. Attention to examination is appropriate.  Psychiatric: Patient is non-anxious.  Head:  Pupils equal, round, and reactive to light.  ENT:  Oral mucosa appears well hydrated.  Neck:   Supple.  ***Full range of motion.  Respiratory: Patient is breathing without any difficulty.  Extremities: No edema.  Vascular: Palpable dorsal pedal pulses.  Skin:   On exposed skin, there are no abnormal skin lesions.  NEUROLOGICAL:     Awake, alert, oriented to person, place, and time.  Speech is clear and fluent. Fund of knowledge is appropriate.   Cranial Nerves: Pupils equal round and reactive to light.  Facial tone is symmetric.  Facial sensation is symmetric.  ROM of spine: ***full.  Palpation of spine: ***non tender.    Strength: Side Biceps Triceps Deltoid Interossei Grip Wrist Ext. Wrist Flex.  R 5 5 5 5 5 5 5   L 5 5 5 5 5 5 5    Side Iliopsoas Quads Hamstring PF DF EHL  R 5 5 5 5 5 5   L 5 5 5 5  5  5   Reflexes are ***2+ and symmetric at the biceps, triceps, brachioradialis, patella and achilles.   Hoffman's is absent.  Clonus is not present.  Toes are down-going.  Bilateral upper and lower extremity sensation is intact to light touch.    Gait is normal.   No difficulty with tandem gait.   No evidence of dysmetria noted.  Medical Decision Making  Imaging: ***  I have personally reviewed the images and agree with the above interpretation.  Assessment and Plan: Douglas Stewart is a pleasant 63 y.o. male with ***    Thank you for involving me in the care of this  patient.   I spent a total of *** minutes in both face-to-face and non-face-to-face activities for this visit on the date of this encounter.   Lyle Decamp, PA-C Dept. of Neurosurgery

## 2024-04-03 ENCOUNTER — Encounter: Payer: Self-pay | Admitting: Diagnostic Neuroimaging

## 2024-04-03 ENCOUNTER — Other Ambulatory Visit (HOSPITAL_COMMUNITY): Payer: Self-pay

## 2024-04-03 ENCOUNTER — Ambulatory Visit: Admitting: Diagnostic Neuroimaging

## 2024-04-03 VITALS — BP 123/82 | HR 108 | Ht 66.0 in | Wt 163.0 lb

## 2024-04-03 DIAGNOSIS — I639 Cerebral infarction, unspecified: Secondary | ICD-10-CM | POA: Diagnosis not present

## 2024-04-03 DIAGNOSIS — M48062 Spinal stenosis, lumbar region with neurogenic claudication: Secondary | ICD-10-CM

## 2024-04-03 MED ORDER — ATORVASTATIN CALCIUM 40 MG PO TABS
40.0000 mg | ORAL_TABLET | Freq: Every day | ORAL | 0 refills | Status: DC
  Filled 2024-04-03 – 2024-04-28 (×2): qty 90, 90d supply, fill #0

## 2024-04-03 MED ORDER — LISINOPRIL 10 MG PO TABS
10.0000 mg | ORAL_TABLET | Freq: Every day | ORAL | 0 refills | Status: DC
  Filled 2024-04-03: qty 90, 90d supply, fill #0

## 2024-04-03 MED ORDER — LISINOPRIL 10 MG PO TABS
10.0000 mg | ORAL_TABLET | Freq: Every day | ORAL | 0 refills | Status: AC
  Filled 2024-04-03 – 2024-04-28 (×2): qty 90, 90d supply, fill #0

## 2024-04-03 MED ORDER — ATORVASTATIN CALCIUM 40 MG PO TABS
40.0000 mg | ORAL_TABLET | Freq: Every day | ORAL | 0 refills | Status: DC
  Filled 2024-04-03: qty 90, 90d supply, fill #0

## 2024-04-03 NOTE — Progress Notes (Signed)
 [facility]  PATIENT: Douglas Stewart DOB: 06/15/1961  REFERRING CLINICIAN: Jerri Pfeiffer, MD HISTORY FROM: patient  REASON FOR VISIT: new consult   HISTORICAL  CHIEF COMPLAINT:  Chief Complaint  Patient presents with   RM 7     Patient is here alone - issues with falls. Last fall was 3 days ago. Heavy pulse from his head and that was when he was told it may have been a stroke     HISTORY OF PRESENT ILLNESS:   63 year old male with hypertension, hyperlipidemia, here for evaluation of hospital stroke follow-up.  01/12/2024 patient presented to the hospital due to worsening gait and balance difficulty.  MRI of the brain was obtained to which showed a small acute to subacute area of focal ischemia in the left frontal operculum.  He was admitted for further workup.  Neurology consultation was obtained.  He was noted to have bilateral lower extremity numbness and weakness for more than a year.  He was found to have severe spinal stenosis at L4-5 level and additional multilevel foraminal stenoses.  Stroke was felt to be an incidental finding due to small vessel disease.  Since that time patient has been at home.  Symptoms of lower extremity weakness continue.  He has seen neurosurgery consult set up for next week to address his low back issue.  Sometimes uses a cane at home.  He had another fall a few days ago.   REVIEW OF SYSTEMS: Full 14 system review of systems performed and negative with exception of: as per HPI.  ALLERGIES: No Known Allergies  HOME MEDICATIONS: Outpatient Medications Prior to Visit  Medication Sig Dispense Refill   aspirin  EC 81 MG tablet Take 1 tablet (81 mg total) by mouth daily. Swallow whole. 90 tablet 0   cyanocobalamin  1000 MCG tablet Take 1 tablet (1,000 mcg total) by mouth daily. 90 tablet 0   atorvastatin  (LIPITOR) 40 MG tablet Take 1 tablet (40 mg total) by mouth daily. 90 tablet 0   lisinopril  (ZESTRIL ) 10 MG tablet Take 1 tablet (10  mg total) by mouth daily. 90 tablet 0   No facility-administered medications prior to visit.    PAST MEDICAL HISTORY: Past Medical History:  Diagnosis Date   Hypertension    Prostate cancer (HCC)     PAST SURGICAL HISTORY: Past Surgical History:  Procedure Laterality Date   no surgical history      FAMILY HISTORY: Family History  Problem Relation Age of Onset   Stroke Neg Hx     SOCIAL HISTORY: Social History   Socioeconomic History   Marital status: Married    Spouse name: Kyra   Number of children: 1   Years of education: 12+   Highest education level: Not on file  Occupational History   Occupation: Consulting civil engineer   Tobacco Use   Smoking status: Former    Current packs/day: 0.00    Types: Cigarettes    Quit date: 06/27/1999    Years since quitting: 24.7   Smokeless tobacco: Never  Substance and Sexual Activity   Alcohol use: No    Alcohol/week: 0.0 standard drinks of alcohol   Drug use: No   Sexual activity: Not on file  Other Topics Concern   Not on file  Social History Narrative   Lives at home with friends   Caffeine use: Drinks coffee occassionally     Social Drivers of Corporate investment banker Strain: Not on file  Food Insecurity: Not on file  Transportation Needs: Not on file  Physical Activity: Not on file  Stress: Not on file  Social Connections: Not on file  Intimate Partner Violence: Not on file     PHYSICAL EXAM  GENERAL EXAM/CONSTITUTIONAL: Vitals:  Vitals:   04/03/24 1638  BP: 123/82  Pulse: (!) 108  SpO2: 96%  Weight: 163 lb (73.9 kg)  Height: 5' 6 (1.676 m)   Body mass index is 26.31 kg/m. Wt Readings from Last 3 Encounters:  04/03/24 163 lb (73.9 kg)  01/12/24 150 lb (68 kg)  09/15/17 197 lb (89.4 kg)   Patient is in no distress; well developed, nourished and groomed; neck is supple  CARDIOVASCULAR: Examination of carotid arteries is normal; no carotid bruits Regular rate and rhythm, no murmurs Examination of  peripheral vascular system by observation and palpation is normal  EYES: Ophthalmoscopic exam of optic discs and posterior segments is normal; no papilledema or hemorrhages No results found.  MUSCULOSKELETAL: Gait, strength, tone, movements noted in Neurologic exam below  NEUROLOGIC: MENTAL STATUS:      No data to display         awake, alert, oriented to person, place and time recent and remote memory intact normal attention and concentration language fluent, comprehension intact, naming intact fund of knowledge appropriate  CRANIAL NERVE:  2nd - no papilledema on fundoscopic exam 2nd, 3rd, 4th, 6th - pupils equal and reactive to light, visual fields full to confrontation, extraocular muscles intact, no nystagmus 5th - facial sensation symmetric 7th - facial strength symmetric 8th - hearing intact 9th - palate elevates symmetrically, uvula midline 11th - shoulder shrug symmetric 12th - tongue protrusion midline  MOTOR:  normal bulk and tone, full strength in the BUE, BLE --> EXCEPT BILATERAL LOWER EXT WEAKNESS --> RIGHT HF 3-4, LEFT HF 3; KE 4+, KF 4; RIGHT DF 4+; LEFT DF 3  SENSORY:  normal and symmetric to light touch, pinprick, temperature, vibration  COORDINATION:  finger-nose-finger, fine finger movements normal  REFLEXES:  deep tendon reflexes TRACE and symmetric  GAIT/STATION:  narrow based gait; ANTALGIC, SLOW GAIT     DIAGNOSTIC DATA (LABS, IMAGING, TESTING) - I reviewed patient records, labs, notes, testing and imaging myself where available.  Lab Results  Component Value Date   WBC 3.7 (L) 01/13/2024   HGB 13.5 01/13/2024   HCT 40.0 01/13/2024   MCV 88.1 01/13/2024   PLT 351 01/13/2024      Component Value Date/Time   NA 134 (L) 01/13/2024 0500   NA 138 05/03/2015 1444   K 3.9 01/13/2024 0500   CL 100 01/13/2024 0500   CO2 24 01/13/2024 0500   GLUCOSE 183 (H) 01/13/2024 0500   BUN 10 01/13/2024 0500   BUN 10 05/03/2015 1444    CREATININE 0.84 01/13/2024 0500   CALCIUM  8.9 01/13/2024 0500   PROT 7.4 01/13/2024 0500   ALBUMIN 3.7 01/13/2024 0500   AST 26 01/13/2024 0500   ALT 12 01/13/2024 0500   ALKPHOS 65 01/13/2024 0500   BILITOT 1.1 01/13/2024 0500   GFRNONAA >60 01/13/2024 0500   GFRAA >60 01/27/2018 1411   Lab Results  Component Value Date   CHOL 182 01/13/2024   HDL 30 (L) 01/13/2024   LDLCALC 126 (H) 01/13/2024   TRIG 132 01/13/2024   CHOLHDL 6.1 01/13/2024   Lab Results  Component Value Date   HGBA1C 6.4 (H) 01/12/2024   Lab Results  Component Value Date   VITAMINB12 176 (L) 01/13/2024   Lab Results  Component Value Date   TSH 0.879 01/12/2024    01/13/24 MRI lumbar spine [I reviewed images myself and agree with interpretation. -VRP]  1. Chronic lumbar spine degeneration. Chronic but progressed and severe multifactorial spinal, lateral recess, and left foraminal stenosis at L4-L5. Query left L4 and/or bilateral L5 radiculitis. 2. Stable Moderate multifactorial spinal stenosis at L3-L4 since 2019. But moderate to severe Left foraminal stenosis there appears progressed. Query left L3 radiculitis. 3. Stable mild spinal stenosis at L2-L3.    ASSESSMENT AND PLAN  63 y.o. year old male here with:   Dx:  1. Spinal stenosis of lumbar region with neurogenic claudication   2. Cerebrovascular accident (CVA), unspecified mechanism (HCC)       PLAN:  63 y.o. male with history of HTN, prostate cancer admitted for gait difficulty with frequent falls for since 2024.   Gait difficulty - likely due to severe lumbar spinal stenosis + B12 deficiency - follow up with neurosurgery consult for lumbar spinal stenosis evaluation  left posterior frontal operculum infarct, likely secondary to small vessel disease source (incidental finding) CT head no acute finding CTA head and neck unremarkable MRI  Punctate focus of acute/early subacute ischemia at the posterior left frontal operculum. 2D  Echo  EF 40-45% LDL 126 HgbA1c 6.4 No antithrombotic prior to admission, then aspirin  81 mg daily and clopidogrel  75 mg daily for 3 weeks and now aspirin  81mg  alone  B12 deficiency B12 = 176; continue B12 supplement   Hypertension Long term BP goal < 120/80 Continue lisinopril  10mg  daily   Hyperlipidemia LDL 126, goal < 70 Continue atorvastatin  40mg  daily   ESTABLISH WITH PRIMARY CARE  - needs PCP; will setup referral; also with renew medications to help with transition of care  Orders Placed This Encounter  Procedures   Ambulatory referral to Internal Medicine   Meds ordered this encounter  Medications   DISCONTD: atorvastatin  (LIPITOR) 40 MG tablet    Sig: Take 1 tablet (40 mg total) by mouth daily.    Dispense:  90 tablet    Refill:  0   DISCONTD: lisinopril  (ZESTRIL ) 10 MG tablet    Sig: Take 1 tablet (10 mg total) by mouth daily.    Dispense:  90 tablet    Refill:  0   lisinopril  (ZESTRIL ) 10 MG tablet    Sig: Take 1 tablet (10 mg total) by mouth daily.    Dispense:  90 tablet    Refill:  0   atorvastatin  (LIPITOR) 40 MG tablet    Sig: Take 1 tablet (40 mg total) by mouth daily.    Dispense:  90 tablet    Refill:  0   Return for return to PCP, pending if symptoms worsen or fail to improve.  I reviewed images, labs, notes, records myself. I summarized findings and reviewed with patient, for this high risk condition (severe lumbar spinal stenosis, stroke) requiring high complexity decision making.   EDUARD FABIENE HANLON, MD 04/03/2024, 6:00 PM Certified in Neurology, Neurophysiology and Neuroimaging  [facility] [address], [address] [place], [place] [address] [phone]

## 2024-04-03 NOTE — Patient Instructions (Addendum)
 LOW BACK PINCHED NERVES --> LUMBAR SPINAL STENOSIS (lower extremity weakness, frequent falls) - follow up with neurosurgery spine consult  STROKE PREVENTION - continue aspirin  81mg  daily, atorvastatin  40mg  daily  B12 deficiency - continue B12 1000mcg daily  ESTABLISH WITH PRIMARY CARE  - needs PCP; will setup referral; also with renew medications to help with transition of care

## 2024-04-07 ENCOUNTER — Ambulatory Visit: Admitting: Orthopedic Surgery

## 2024-04-08 ENCOUNTER — Ambulatory Visit: Admitting: Physician Assistant

## 2024-04-14 ENCOUNTER — Other Ambulatory Visit (HOSPITAL_COMMUNITY): Payer: Self-pay

## 2024-04-28 ENCOUNTER — Other Ambulatory Visit (HOSPITAL_COMMUNITY): Payer: Self-pay

## 2024-04-28 ENCOUNTER — Other Ambulatory Visit: Payer: Self-pay

## 2024-05-05 ENCOUNTER — Emergency Department (HOSPITAL_COMMUNITY)

## 2024-05-05 ENCOUNTER — Inpatient Hospital Stay (HOSPITAL_COMMUNITY)
Admission: EM | Admit: 2024-05-05 | Discharge: 2024-05-08 | DRG: 065 | Disposition: A | Discharge status: Skilled Nursing Facility | Attending: Internal Medicine | Admitting: Internal Medicine

## 2024-05-05 ENCOUNTER — Encounter (HOSPITAL_COMMUNITY): Payer: Self-pay | Admitting: Emergency Medicine

## 2024-05-05 DIAGNOSIS — Z9181 History of falling: Secondary | ICD-10-CM

## 2024-05-05 DIAGNOSIS — E119 Type 2 diabetes mellitus without complications: Secondary | ICD-10-CM | POA: Diagnosis present

## 2024-05-05 DIAGNOSIS — Z8546 Personal history of malignant neoplasm of prostate: Secondary | ICD-10-CM

## 2024-05-05 DIAGNOSIS — R29704 NIHSS score 4: Secondary | ICD-10-CM | POA: Diagnosis present

## 2024-05-05 DIAGNOSIS — Z7902 Long term (current) use of antithrombotics/antiplatelets: Secondary | ICD-10-CM

## 2024-05-05 DIAGNOSIS — G8194 Hemiplegia, unspecified affecting left nondominant side: Secondary | ICD-10-CM | POA: Diagnosis present

## 2024-05-05 DIAGNOSIS — Z7982 Long term (current) use of aspirin: Secondary | ICD-10-CM

## 2024-05-05 DIAGNOSIS — I639 Cerebral infarction, unspecified: Principal | ICD-10-CM | POA: Diagnosis present

## 2024-05-05 DIAGNOSIS — I6381 Other cerebral infarction due to occlusion or stenosis of small artery: Secondary | ICD-10-CM | POA: Diagnosis not present

## 2024-05-05 DIAGNOSIS — E785 Hyperlipidemia, unspecified: Secondary | ICD-10-CM | POA: Diagnosis present

## 2024-05-05 DIAGNOSIS — Y92238 Other place in hospital as the place of occurrence of the external cause: Secondary | ICD-10-CM | POA: Diagnosis present

## 2024-05-05 DIAGNOSIS — R296 Repeated falls: Secondary | ICD-10-CM | POA: Diagnosis present

## 2024-05-05 DIAGNOSIS — R32 Unspecified urinary incontinence: Secondary | ICD-10-CM | POA: Diagnosis present

## 2024-05-05 DIAGNOSIS — W1830XA Fall on same level, unspecified, initial encounter: Secondary | ICD-10-CM | POA: Diagnosis present

## 2024-05-05 DIAGNOSIS — K5900 Constipation, unspecified: Secondary | ICD-10-CM | POA: Diagnosis present

## 2024-05-05 DIAGNOSIS — Z23 Encounter for immunization: Secondary | ICD-10-CM

## 2024-05-05 DIAGNOSIS — Z91148 Patient's other noncompliance with medication regimen for other reason: Secondary | ICD-10-CM

## 2024-05-05 DIAGNOSIS — R29706 NIHSS score 6: Secondary | ICD-10-CM | POA: Diagnosis not present

## 2024-05-05 DIAGNOSIS — R29810 Facial weakness: Secondary | ICD-10-CM | POA: Diagnosis present

## 2024-05-05 DIAGNOSIS — Z87891 Personal history of nicotine dependence: Secondary | ICD-10-CM

## 2024-05-05 DIAGNOSIS — I10 Essential (primary) hypertension: Secondary | ICD-10-CM | POA: Diagnosis present

## 2024-05-05 LAB — CBC WITH DIFFERENTIAL/PLATELET
Abs Immature Granulocytes: 0.02 K/uL (ref 0.00–0.07)
Basophils Absolute: 0 K/uL (ref 0.0–0.1)
Basophils Relative: 1 %
Eosinophils Absolute: 0 K/uL (ref 0.0–0.5)
Eosinophils Relative: 1 %
HCT: 40.4 % (ref 39.0–52.0)
Hemoglobin: 13.7 g/dL (ref 13.0–17.0)
Immature Granulocytes: 0 %
Lymphocytes Relative: 28 %
Lymphs Abs: 1.6 K/uL (ref 0.7–4.0)
MCH: 30 pg (ref 26.0–34.0)
MCHC: 33.9 g/dL (ref 30.0–36.0)
MCV: 88.4 fL (ref 80.0–100.0)
Monocytes Absolute: 0.6 K/uL (ref 0.1–1.0)
Monocytes Relative: 10 %
Neutro Abs: 3.4 K/uL (ref 1.7–7.7)
Neutrophils Relative %: 60 %
Platelets: 246 K/uL (ref 150–400)
RBC: 4.57 MIL/uL (ref 4.22–5.81)
RDW: 12.3 % (ref 11.5–15.5)
WBC: 5.6 K/uL (ref 4.0–10.5)
nRBC: 0 % (ref 0.0–0.2)

## 2024-05-05 LAB — URINALYSIS, W/ REFLEX TO CULTURE (INFECTION SUSPECTED)
Bacteria, UA: NONE SEEN
Bilirubin Urine: NEGATIVE
Glucose, UA: NEGATIVE mg/dL
Hgb urine dipstick: NEGATIVE
Ketones, ur: NEGATIVE mg/dL
Leukocytes,Ua: NEGATIVE
Nitrite: NEGATIVE
Protein, ur: 30 mg/dL — AB
Specific Gravity, Urine: 1.023 (ref 1.005–1.030)
pH: 5 (ref 5.0–8.0)

## 2024-05-05 LAB — BASIC METABOLIC PANEL WITH GFR
Anion gap: 15 (ref 5–15)
BUN: 12 mg/dL (ref 8–23)
CO2: 23 mmol/L (ref 22–32)
Calcium: 9.5 mg/dL (ref 8.9–10.3)
Chloride: 100 mmol/L (ref 98–111)
Creatinine, Ser: 0.75 mg/dL (ref 0.61–1.24)
GFR, Estimated: >60 mL/min (ref >60)
Glucose, Bld: 187 mg/dL — ABNORMAL HIGH (ref 70–99)
Potassium: 3.9 mmol/L (ref 3.5–5.1)
Sodium: 138 mmol/L (ref 135–145)

## 2024-05-05 NOTE — ED Notes (Signed)
 Pt taken to CT.

## 2024-05-05 NOTE — ED Notes (Signed)
 PT continues to wait on MRI. No signs of distress.

## 2024-05-05 NOTE — ED Notes (Signed)
 Patient transported to MRI

## 2024-05-05 NOTE — ED Triage Notes (Signed)
 First Nurse Note: Pt presents with c/o of low back pain and frequent falls for 2-3 months. He denies any lightheaded or dizziness as the cause to his falls.

## 2024-05-05 NOTE — ED Provider Triage Note (Signed)
 Emergency Medicine Provider Triage Evaluation Note  OLLIS DAUDELIN , a 63 y.o. male  was evaluated in triage.  Pt complains of recurrent falls.  Patient states he has had recurrent falls over the last few months however worse over the last week.  Falling almost daily.  He uses a walker at home.  He has pain to his right lower back feels like his legs do not work like they should be.  He had a prior stroke in July.  Review of Systems  Positive: Recurrent falls, low back pain Negative:   Physical Exam  BP (!) 181/114 (BP Location: Left Arm)   Pulse 97   Temp 97.8 F (36.6 C)   Resp 18   Ht 5' 6 (1.676 m)   SpO2 100%   BMI 26.31 kg/m  Gen:   Awake, no distress   Resp:  Normal effort  MSK:   Moves extremities without difficulty  Other:  Left facial droop, left sided weakness  Medical Decision Making  Medically screening exam initiated at 3:19 PM.  Appropriate orders placed.  Norleen GORMAN Mauck was informed that the remainder of the evaluation will be completed by another provider, this initial triage assessment does not replace that evaluation, and the importance of remaining in the ED until their evaluation is complete.  Recurrent falls, back pain- right   Mark Hassey A, PA-C 05/05/24 1520

## 2024-05-05 NOTE — ED Notes (Addendum)
 EDT notified this RN that pt was in the bathroom and fell after pt failed to pull the nurse call cord after toileting as instructed. This RN responded to assess pt. Pt denies hitting his head or having any new pain. Pt assisted by to [facility] and taken into triage room. Brittani, PA-C notified and evaluated pt. No new orders received.

## 2024-05-05 NOTE — ED Notes (Signed)
 Sandwich given

## 2024-05-05 NOTE — ED Provider Notes (Signed)
 [place] EMERGENCY DEPARTMENT AT [facility] Provider Note   CSN: [id number] Arrival date & time: 05/05/24  1458     Patient presents with: Fall and Back Pain   Douglas Stewart is a 63 y.o. male.  {Add pertinent medical, surgical, social history, OB history to YEP:67052} Patient is a 63 year old male presenting for multiple falls.  Patient states he has had multiple falls over the past 2 months with increased severity over the last week.  He states he fell 4 times yesterday.  He is presenting for back pain due to the fall and evaluation for unsteadiness.  He admits to left lower extremity weakness as compared to the right.  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  He typically uses a walker at home.  He lives alone.    Of note, patient also fell in the emergency department after attempting to get up from the toilet alone.  States he fell due to weakness in the left leg.  He denies any infectious signs or symptoms.  No urinary symptoms.  No black or bloody stools.  The history is provided by the patient. No language interpreter was used.  Fall Pertinent negatives include no chest pain, no abdominal pain and no shortness of breath.  Back Pain Associated symptoms: weakness   Associated symptoms: no abdominal pain, no chest pain, no dysuria and no fever        Prior to Admission medications   Medication Sig Start Date End Date Taking? Authorizing Provider  atorvastatin  (LIPITOR) 40 MG tablet Take 1 tablet (40 mg total) by mouth daily. 04/03/24 07/27/24  Penumalli, Eduard SAUNDERS, MD  lisinopril  (ZESTRIL ) 10 MG tablet Take 1 tablet (10 mg total) by mouth daily. 04/03/24 07/27/24  Penumalli, Vikram R, MD    Allergies: Patient has no known allergies.    Review of Systems  Constitutional:  Negative for chills and fever.  HENT:  Negative for ear pain and sore throat.   Eyes:  Negative for pain and visual  disturbance.  Respiratory:  Negative for cough and shortness of breath.   Cardiovascular:  Negative for chest pain and palpitations.  Gastrointestinal:  Negative for abdominal pain and vomiting.  Genitourinary:  Negative for dysuria and hematuria.  Musculoskeletal:  Positive for back pain and gait problem. Negative for arthralgias.  Skin:  Negative for color change and rash.  Neurological:  Positive for weakness. Negative for seizures and syncope.  All other systems reviewed and are negative.   Updated Vital Signs BP (!) 150/94   Pulse 89   Temp 99.1 F (37.3 C)   Resp 17   Ht 5' 6 (1.676 m)   SpO2 99%   BMI 26.31 kg/m   Physical Exam Vitals and nursing note reviewed.  Constitutional:      General: He is not in acute distress.    Appearance: He is well-developed.  HENT:     Head: Normocephalic and atraumatic.  Eyes:     Conjunctiva/sclera: Conjunctivae normal.  Cardiovascular:     Rate and Rhythm: Normal rate and regular rhythm.     Heart sounds: No murmur heard. Pulmonary:     Effort: Pulmonary effort is normal. No respiratory distress.     Breath sounds: Normal breath sounds.  Abdominal:     Palpations: Abdomen is soft.     Tenderness: There is no abdominal tenderness.  Musculoskeletal:  General: No swelling.     Cervical back: Neck supple.  Skin:    General: Skin is warm and dry.     Capillary Refill: Capillary refill takes less than 2 seconds.  Neurological:     Mental Status: He is alert.     GCS: GCS eye subscore is 4. GCS verbal subscore is 5. GCS motor subscore is 6.     Comments: Patient's speech is difficult to understand.  I am unsure if this is due to his accent or dysarthria.  He has a left lower extremity weakness as compared to the right with plantarflexion and extension.  Upper extremities are symmetrical motor strength.    No sensation deficits.  Psychiatric:        Mood and Affect: Mood normal.     (all labs ordered are listed, but  only abnormal results are displayed) Labs Reviewed  BASIC METABOLIC PANEL WITH GFR - Abnormal; Notable for the following components:      Result Value   Glucose, Bld 187 (*)    All other components within normal limits  URINALYSIS, W/ REFLEX TO CULTURE (INFECTION SUSPECTED) - Abnormal; Notable for the following components:   Protein, ur 30 (*)    All other components within normal limits  CBC WITH DIFFERENTIAL/PLATELET    EKG: None  Radiology: DG Lumbar Spine Complete Result Date: 05/05/2024 EXAM: 4 OR MORE VIEW(S) XRAY OF THE LUMBAR SPINE 05/05/2024 03:57:00 PM COMPARISON: Lumbar spine x-ray from 11/12/2017. CLINICAL HISTORY: falls FINDINGS: LUMBAR SPINE: BONES: No acute fracture. No aggressive appearing osseous lesion. Alignment is normal. Prostate radiotherapy seeds are present. DISCS AND DEGENERATIVE CHANGES: Moderate degenerative changes at L4-L5 with mild disc space narrowing, vacuum disc phenomenon and endplate sclerosis which has increased compared to 2019. SOFT TISSUES: No acute abnormality. IMPRESSION: 1. No acute abnormality of the lumbar spine related to the falls. 2. Moderate degenerative changes at L4-L5 have increased compared to 2019. Electronically signed by: Greig Pique MD 05/05/2024 04:26 PM EST RP Workstation: [id number]   CT Head Wo Contrast Result Date: 05/05/2024 CLINICAL DATA:  Head and neck trauma EXAM: CT HEAD WITHOUT CONTRAST CT CERVICAL SPINE WITHOUT CONTRAST TECHNIQUE: Multidetector CT imaging of the head and cervical spine was performed following the standard protocol without intravenous contrast. Multiplanar CT image reconstructions of the cervical spine were also generated. RADIATION DOSE REDUCTION: This exam was performed according to the departmental dose-optimization program which includes automated exposure control, adjustment of the mA and/or kV according to patient size and/or use of iterative reconstruction technique. COMPARISON:  None Available. FINDINGS:  CT HEAD FINDINGS Brain: No evidence of acute infarction, hemorrhage, hydrocephalus, extra-axial collection or mass lesion/mass effect. Periventricular and deep white matter hypodensity. Vascular: No hyperdense vessel or unexpected calcification. Skull: Normal. Negative for fracture or focal lesion. Sinuses/Orbits: No acute finding. Other: None. CT CERVICAL SPINE FINDINGS Alignment: Degenerative straightening of the normal cervical lordosis. Skull base and vertebrae: No acute fracture. No primary bone lesion or focal pathologic process. Soft tissues and spinal canal: No prevertebral fluid or swelling. No visible canal hematoma. Disc levels: Moderate disc degenerative change of C5 through C7 with otherwise intact disc spaces. Upper chest: Negative. Other: None. IMPRESSION: 1. No acute intracranial pathology. Small-vessel white matter disease. 2. No fracture or static subluxation of the cervical spine. 3. Moderate disc degenerative change of C5 through C7 with otherwise intact disc spaces. Electronically Signed   By: Marolyn JONETTA Jaksch M.D.   On: 05/05/2024 16:03   CT Cervical Spine Wo  Contrast Result Date: 05/05/2024 CLINICAL DATA:  Head and neck trauma EXAM: CT HEAD WITHOUT CONTRAST CT CERVICAL SPINE WITHOUT CONTRAST TECHNIQUE: Multidetector CT imaging of the head and cervical spine was performed following the standard protocol without intravenous contrast. Multiplanar CT image reconstructions of the cervical spine were also generated. RADIATION DOSE REDUCTION: This exam was performed according to the departmental dose-optimization program which includes automated exposure control, adjustment of the mA and/or kV according to patient size and/or use of iterative reconstruction technique. COMPARISON:  None Available. FINDINGS: CT HEAD FINDINGS Brain: No evidence of acute infarction, hemorrhage, hydrocephalus, extra-axial collection or mass lesion/mass effect. Periventricular and deep white matter hypodensity. Vascular:  No hyperdense vessel or unexpected calcification. Skull: Normal. Negative for fracture or focal lesion. Sinuses/Orbits: No acute finding. Other: None. CT CERVICAL SPINE FINDINGS Alignment: Degenerative straightening of the normal cervical lordosis. Skull base and vertebrae: No acute fracture. No primary bone lesion or focal pathologic process. Soft tissues and spinal canal: No prevertebral fluid or swelling. No visible canal hematoma. Disc levels: Moderate disc degenerative change of C5 through C7 with otherwise intact disc spaces. Upper chest: Negative. Other: None. IMPRESSION: 1. No acute intracranial pathology. Small-vessel white matter disease. 2. No fracture or static subluxation of the cervical spine. 3. Moderate disc degenerative change of C5 through C7 with otherwise intact disc spaces. Electronically Signed   By: Marolyn JONETTA Jaksch M.D.   On: 05/05/2024 16:03    {Document cardiac monitor, telemetry assessment procedure when appropriate:32947} Procedures   Medications Ordered in the ED - No data to display    {Click here for ABCD2, HEART and other calculators REFRESH Note before signing:1}                              Medical Decision Making Amount and/or Complexity of Data Reviewed Radiology: ordered.   ***  {Document critical care time when appropriate  Document review of labs and clinical decision tools ie [id number], etc  Document your independent review of radiology images and any outside records  Document your discussion with family members, caretakers and with consultants  Document social determinants of health affecting pt's care  Document your decision making why or why not admission, treatments were needed:32947:::1}   Final diagnoses:  None    ED Discharge Orders     None

## 2024-05-06 ENCOUNTER — Observation Stay (HOSPITAL_COMMUNITY)

## 2024-05-06 ENCOUNTER — Emergency Department (HOSPITAL_COMMUNITY)

## 2024-05-06 ENCOUNTER — Other Ambulatory Visit: Payer: Self-pay

## 2024-05-06 ENCOUNTER — Encounter (HOSPITAL_COMMUNITY): Payer: Self-pay | Admitting: Family Medicine

## 2024-05-06 DIAGNOSIS — R29704 NIHSS score 4: Secondary | ICD-10-CM | POA: Diagnosis present

## 2024-05-06 DIAGNOSIS — M5416 Radiculopathy, lumbar region: Secondary | ICD-10-CM

## 2024-05-06 DIAGNOSIS — E78019 Familial hypercholesterolemia, unspecified: Secondary | ICD-10-CM | POA: Diagnosis not present

## 2024-05-06 DIAGNOSIS — I10 Essential (primary) hypertension: Secondary | ICD-10-CM

## 2024-05-06 DIAGNOSIS — Z91148 Patient's other noncompliance with medication regimen for other reason: Secondary | ICD-10-CM | POA: Diagnosis not present

## 2024-05-06 DIAGNOSIS — E1140 Type 2 diabetes mellitus with diabetic neuropathy, unspecified: Secondary | ICD-10-CM

## 2024-05-06 DIAGNOSIS — I6389 Other cerebral infarction: Secondary | ICD-10-CM

## 2024-05-06 DIAGNOSIS — R296 Repeated falls: Secondary | ICD-10-CM | POA: Diagnosis present

## 2024-05-06 DIAGNOSIS — I639 Cerebral infarction, unspecified: Secondary | ICD-10-CM | POA: Diagnosis present

## 2024-05-06 DIAGNOSIS — I6381 Other cerebral infarction due to occlusion or stenosis of small artery: Secondary | ICD-10-CM | POA: Diagnosis present

## 2024-05-06 DIAGNOSIS — R32 Unspecified urinary incontinence: Secondary | ICD-10-CM | POA: Diagnosis present

## 2024-05-06 DIAGNOSIS — Z7902 Long term (current) use of antithrombotics/antiplatelets: Secondary | ICD-10-CM | POA: Diagnosis not present

## 2024-05-06 DIAGNOSIS — E785 Hyperlipidemia, unspecified: Secondary | ICD-10-CM | POA: Diagnosis present

## 2024-05-06 DIAGNOSIS — G8194 Hemiplegia, unspecified affecting left nondominant side: Secondary | ICD-10-CM | POA: Diagnosis present

## 2024-05-06 DIAGNOSIS — E1151 Type 2 diabetes mellitus with diabetic peripheral angiopathy without gangrene: Secondary | ICD-10-CM | POA: Diagnosis not present

## 2024-05-06 DIAGNOSIS — Z8546 Personal history of malignant neoplasm of prostate: Secondary | ICD-10-CM | POA: Diagnosis not present

## 2024-05-06 DIAGNOSIS — Z9181 History of falling: Secondary | ICD-10-CM | POA: Diagnosis not present

## 2024-05-06 DIAGNOSIS — R29706 NIHSS score 6: Secondary | ICD-10-CM | POA: Diagnosis not present

## 2024-05-06 DIAGNOSIS — Y92238 Other place in hospital as the place of occurrence of the external cause: Secondary | ICD-10-CM | POA: Diagnosis present

## 2024-05-06 DIAGNOSIS — R29707 NIHSS score 7: Secondary | ICD-10-CM

## 2024-05-06 DIAGNOSIS — W1830XA Fall on same level, unspecified, initial encounter: Secondary | ICD-10-CM | POA: Diagnosis present

## 2024-05-06 DIAGNOSIS — R29810 Facial weakness: Secondary | ICD-10-CM | POA: Diagnosis present

## 2024-05-06 DIAGNOSIS — E119 Type 2 diabetes mellitus without complications: Secondary | ICD-10-CM | POA: Diagnosis present

## 2024-05-06 DIAGNOSIS — K5900 Constipation, unspecified: Secondary | ICD-10-CM | POA: Diagnosis present

## 2024-05-06 DIAGNOSIS — Z87891 Personal history of nicotine dependence: Secondary | ICD-10-CM | POA: Diagnosis not present

## 2024-05-06 DIAGNOSIS — Z7982 Long term (current) use of aspirin: Secondary | ICD-10-CM | POA: Diagnosis not present

## 2024-05-06 DIAGNOSIS — Z8673 Personal history of transient ischemic attack (TIA), and cerebral infarction without residual deficits: Secondary | ICD-10-CM | POA: Insufficient documentation

## 2024-05-06 DIAGNOSIS — Z23 Encounter for immunization: Secondary | ICD-10-CM | POA: Diagnosis present

## 2024-05-06 LAB — RAPID URINE DRUG SCREEN, HOSP PERFORMED
Amphetamines: NOT DETECTED
Barbiturates: NOT DETECTED
Benzodiazepines: NOT DETECTED
Cocaine: NOT DETECTED
Opiates: NOT DETECTED
Tetrahydrocannabinol: NOT DETECTED

## 2024-05-06 LAB — ECHOCARDIOGRAM COMPLETE
Area-P 1/2: 2.66 cm2
Calc EF: 50 %
Height: 66 in
S' Lateral: 2.6 cm
Single Plane A2C EF: 52.8 %
Single Plane A4C EF: 44.6 %
Weight: 2606.72 [oz_av]

## 2024-05-06 LAB — LIPID PANEL
Cholesterol: 174 mg/dL (ref 0–200)
HDL: 45 mg/dL (ref >40)
LDL Cholesterol: 111 mg/dL — ABNORMAL HIGH (ref 0–99)
Total CHOL/HDL Ratio: 3.9 ratio
Triglycerides: 90 mg/dL (ref <150)
VLDL: 18 mg/dL (ref 0–40)

## 2024-05-06 LAB — CBG MONITORING, ED
Glucose-Capillary: 265 mg/dL — ABNORMAL HIGH (ref 70–99)
Glucose-Capillary: 207 mg/dL — ABNORMAL HIGH (ref 70–99)

## 2024-05-06 LAB — HEMOGLOBIN A1C
Hgb A1c MFr Bld: 6.8 % — ABNORMAL HIGH (ref 4.8–5.6)
Mean Plasma Glucose: 148.46 mg/dL

## 2024-05-06 LAB — GLUCOSE, CAPILLARY
Glucose-Capillary: 149 mg/dL — ABNORMAL HIGH (ref 70–99)
Glucose-Capillary: 268 mg/dL — ABNORMAL HIGH (ref 70–99)

## 2024-05-06 MED ORDER — CLOPIDOGREL BISULFATE 75 MG PO TABS
75.0000 mg | ORAL_TABLET | Freq: Every day | ORAL | Status: DC
  Administered 2024-05-06 – 2024-05-08 (×3): 75 mg via ORAL
  Filled 2024-05-06 (×3): qty 1

## 2024-05-06 MED ORDER — INFLUENZA VIRUS VACC SPLIT PF (FLUZONE) 0.5 ML IM SUSY
0.5000 mL | PREFILLED_SYRINGE | INTRAMUSCULAR | Status: AC
  Administered 2024-05-08: 0.5 mL via INTRAMUSCULAR
  Filled 2024-05-06: qty 0.5

## 2024-05-06 MED ORDER — IOHEXOL 350 MG/ML SOLN
75.0000 mL | Freq: Once | INTRAVENOUS | Status: AC | PRN
  Administered 2024-05-06: 75 mL via INTRAVENOUS

## 2024-05-06 MED ORDER — SODIUM CHLORIDE 0.9 % IV SOLN: INTRAVENOUS | Status: AC

## 2024-05-06 MED ORDER — SENNOSIDES-DOCUSATE SODIUM 8.6-50 MG PO TABS: 1.0000 | ORAL_TABLET | Freq: Every evening | ORAL | Status: DC | PRN

## 2024-05-06 MED ORDER — ACETAMINOPHEN 650 MG RE SUPP: 650.0000 mg | RECTAL | Status: DC | PRN

## 2024-05-06 MED ORDER — ATORVASTATIN CALCIUM 80 MG PO TABS
80.0000 mg | ORAL_TABLET | Freq: Every day | ORAL | Status: DC
  Administered 2024-05-07 – 2024-05-08 (×2): 80 mg via ORAL
  Filled 2024-05-06 (×2): qty 1

## 2024-05-06 MED ORDER — ACETAMINOPHEN 325 MG PO TABS: 650.0000 mg | ORAL_TABLET | ORAL | Status: DC | PRN

## 2024-05-06 MED ORDER — INSULIN ASPART 100 UNIT/ML IJ SOLN
0.0000 [IU] | Freq: Three times a day (TID) | INTRAMUSCULAR | Status: DC
  Administered 2024-05-06: 2 [IU] via SUBCUTANEOUS
  Administered 2024-05-06: 3 [IU] via SUBCUTANEOUS
  Administered 2024-05-07: 2 [IU] via SUBCUTANEOUS
  Administered 2024-05-07: 3 [IU] via SUBCUTANEOUS
  Administered 2024-05-07: 1 [IU] via SUBCUTANEOUS
  Administered 2024-05-08: 4 [IU] via SUBCUTANEOUS
  Administered 2024-05-08: 1 [IU] via SUBCUTANEOUS
  Filled 2024-05-06 (×4): qty 2
  Filled 2024-05-06: qty 3
  Filled 2024-05-06: qty 1

## 2024-05-06 MED ORDER — STROKE: EARLY STAGES OF RECOVERY BOOK
Freq: Once | Status: AC
  Filled 2024-05-06: qty 1

## 2024-05-06 MED ORDER — ACETAMINOPHEN 160 MG/5ML PO SOLN: 650.0000 mg | ORAL | Status: DC | PRN

## 2024-05-06 MED ORDER — ATORVASTATIN CALCIUM 40 MG PO TABS
40.0000 mg | ORAL_TABLET | Freq: Every day | ORAL | Status: DC
  Administered 2024-05-06: 40 mg via ORAL
  Filled 2024-05-06: qty 1

## 2024-05-06 MED ORDER — ASPIRIN 81 MG PO TBEC
81.0000 mg | DELAYED_RELEASE_TABLET | Freq: Every day | ORAL | Status: DC
  Administered 2024-05-06 – 2024-05-08 (×3): 81 mg via ORAL
  Filled 2024-05-06 (×3): qty 1

## 2024-05-06 MED ORDER — PERFLUTREN LIPID MICROSPHERE
1.0000 mL | INTRAVENOUS | Status: AC | PRN
  Administered 2024-05-06: 2 mL via INTRAVENOUS

## 2024-05-06 MED ORDER — PNEUMOCOCCAL 20-VAL CONJ VACC 0.5 ML IM SUSY
0.5000 mL | PREFILLED_SYRINGE | INTRAMUSCULAR | Status: AC
  Administered 2024-05-08: 0.5 mL via INTRAMUSCULAR
  Filled 2024-05-06: qty 0.5

## 2024-05-06 MED ORDER — ENOXAPARIN SODIUM 40 MG/0.4ML IJ SOSY
40.0000 mg | PREFILLED_SYRINGE | INTRAMUSCULAR | Status: DC
  Administered 2024-05-06 – 2024-05-08 (×3): 40 mg via SUBCUTANEOUS
  Filled 2024-05-06 (×3): qty 0.4

## 2024-05-06 MED ORDER — INSULIN ASPART 100 UNIT/ML IJ SOLN
0.0000 [IU] | Freq: Every day | INTRAMUSCULAR | Status: DC
  Administered 2024-05-06: 3 [IU] via SUBCUTANEOUS
  Administered 2024-05-07: 2 [IU] via SUBCUTANEOUS
  Filled 2024-05-06: qty 2
  Filled 2024-05-06: qty 1

## 2024-05-06 NOTE — Plan of Care (Signed)

## 2024-05-06 NOTE — Progress Notes (Addendum)
 STROKE TEAM PROGRESS NOTE   INTERIM HISTORY/SUBJECTIVE Seen in emergency department, no family at the bedside.  He has poor insight to his health and medications.   OBJECTIVE  CBC    Component Value Date/Time   WBC 5.6 05/05/2024 1530   RBC 4.57 05/05/2024 1530   HGB 13.7 05/05/2024 1530   HCT 40.4 05/05/2024 1530   PLT 246 05/05/2024 1530   MCV 88.4 05/05/2024 1530   MCH 30.0 05/05/2024 1530   MCHC 33.9 05/05/2024 1530   RDW 12.3 05/05/2024 1530   LYMPHSABS 1.6 05/05/2024 1530   MONOABS 0.6 05/05/2024 1530   EOSABS 0.0 05/05/2024 1530   BASOSABS 0.0 05/05/2024 1530    BMET    Component Value Date/Time   NA 138 05/05/2024 1530   NA 138 05/03/2015 1444   K 3.9 05/05/2024 1530   CL 100 05/05/2024 1530   CO2 23 05/05/2024 1530   GLUCOSE 187 (H) 05/05/2024 1530   BUN 12 05/05/2024 1530   BUN 10 05/03/2015 1444   CREATININE 0.75 05/05/2024 1530   CALCIUM  9.5 05/05/2024 1530   GFRNONAA >60 05/05/2024 1530    IMAGING past 24 hours CT ANGIO HEAD NECK W WO CM Result Date: 05/06/2024 CLINICAL DATA:  Initial evaluation for acute neuro deficit, stroke. EXAM: CT ANGIOGRAPHY HEAD AND NECK WITH AND WITHOUT CONTRAST TECHNIQUE: Multidetector CT imaging of the head and neck was performed using the standard protocol during bolus administration of intravenous contrast. Multiplanar CT image reconstructions and MIPs were obtained to evaluate the vascular anatomy. Carotid stenosis measurements (when applicable) are obtained utilizing NASCET criteria, using the distal internal carotid diameter as the denominator. RADIATION DOSE REDUCTION: This exam was performed according to the departmental dose-optimization program which includes automated exposure control, adjustment of the mA and/or kV according to patient size and/or use of iterative reconstruction technique. CONTRAST:  75mL OMNIPAQUE  IOHEXOL  350 MG/ML SOLN COMPARISON:  Comparison made with MRI from earlier the same day as well as prior exam  from 01/12/2024. FINDINGS: CTA NECK FINDINGS Aortic arch: Aortic arch within normal limits for caliber with standard branch pattern. Mild aortic atherosclerosis. No stenosis about the origin of the great vessels. Right carotid system: Right common and internal carotid arteries are patent without dissection. Mild atheromatous irregularity about the l right carotid bulb without hemodynamically significant stenosis. Left carotid system: Left common and internal carotid arteries are patent without dissection. Eccentric mixed plaque about the left carotid bulb without hemodynamically significant greater than 50% stenosis. Vertebral arteries: Left vertebral artery slightly dominant. Vertebral arteries patent without stenosis or dissection. Skeleton: No worrisome osseous lesions. Moderate spondylosis present at C5-6 and C6-7. Other neck: No other acute finding. Upper chest: No other acute finding. Review of the MIP images confirms the above findings CTA HEAD FINDINGS Anterior circulation: Both internal carotid arteries widely patent to the termini without stenosis. A1 segments widely patent. Normal anterior communicating artery complex. Both anterior cerebral arteries widely patent to their distal aspects without stenosis. No M1 stenosis or occlusion. Normal MCA bifurcations. Distal MCA branches well perfused and symmetric. Posterior circulation: Both V4 segments patent without stenosis. Left PICA patent. Right PICA not well seen. Basilar patent without stenosis. Superior cerebral arteries patent bilaterally. Right PCA supplied via the basilar. Predominant fetal type origin left PCA. Both PCAs patent without stenosis. Venous sinuses: Patent allowing for timing the contrast bolus. Anatomic variants: As above.  No aneurysm. Review of the MIP images confirms the above findings IMPRESSION: 1. Negative CTA for large vessel  occlusion or other emergent finding. 2. Mild atheromatous change about the carotid bifurcations without  hemodynamically significant stenosis. Aortic Atherosclerosis (ICD10-I70.0). Electronically Signed   By: Morene Hoard M.D.   On: 05/06/2024 04:35   MR BRAIN WO CONTRAST Result Date: 05/06/2024 CLINICAL DATA:  Initial evaluation for multiple falls, history of stroke. EXAM: MRI HEAD WITHOUT CONTRAST TECHNIQUE: Multiplanar, multiecho pulse sequences of the brain and surrounding structures were obtained without intravenous contrast. COMPARISON:  ET from 05/05/2024. FINDINGS: Brain: Age-related cerebral atrophy. Patchy T2/FLAIR hyperintensity involving the supratentorial cerebral white matter, consistent with chronic small vessel ischemic disease. 2 cm acute ischemic nonhemorrhagic infarcts seen involving the right basal ganglia of/corona radiata (series 5, image 83). No significant mass effect. Gray-white matter differentiation otherwise maintained. No acute or significant chronic intracranial blood products. No mass lesion, midline shift or mass effect. Ventricular prominence related global parenchymal volume loss without hydrocephalus. No extra-axial fluid collection. Pituitary gland within normal limits. Vascular: Major intracranial vascular flow voids are maintained. Skull and upper cervical spine: Craniocervical junction within normal limits. Bone marrow signal intensity normal. No scalp soft tissue abnormality. Sinuses/Orbits: Prior bilateral ocular lens replacement. Scattered mucosal thickening noted about the ethmoidal air cells and maxillary sinuses. Few scattered superimposed left maxillary sinus retention cyst noted. No significant mastoid effusion. Other: None. IMPRESSION: 1. 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 2. Underlying age-related cerebral atrophy with chronic small vessel ischemic disease. Electronically Signed   By: Morene Hoard M.D.   On: 05/06/2024 01:08   DG Lumbar Spine Complete Result Date: 05/05/2024 EXAM: 4 OR MORE VIEW(S) XRAY  OF THE LUMBAR SPINE 05/05/2024 03:57:00 PM COMPARISON: Lumbar spine x-ray from 11/12/2017. CLINICAL HISTORY: falls FINDINGS: LUMBAR SPINE: BONES: No acute fracture. No aggressive appearing osseous lesion. Alignment is normal. Prostate radiotherapy seeds are present. DISCS AND DEGENERATIVE CHANGES: Moderate degenerative changes at L4-L5 with mild disc space narrowing, vacuum disc phenomenon and endplate sclerosis which has increased compared to 2019. SOFT TISSUES: No acute abnormality. IMPRESSION: 1. No acute abnormality of the lumbar spine related to the falls. 2. Moderate degenerative changes at L4-L5 have increased compared to 2019. Electronically signed by: Greig Pique MD 05/05/2024 04:26 PM EST RP Workstation: [id number]   CT Head Wo Contrast Result Date: 05/05/2024 CLINICAL DATA:  Head and neck trauma EXAM: CT HEAD WITHOUT CONTRAST CT CERVICAL SPINE WITHOUT CONTRAST TECHNIQUE: Multidetector CT imaging of the head and cervical spine was performed following the standard protocol without intravenous contrast. Multiplanar CT image reconstructions of the cervical spine were also generated. RADIATION DOSE REDUCTION: This exam was performed according to the departmental dose-optimization program which includes automated exposure control, adjustment of the mA and/or kV according to patient size and/or use of iterative reconstruction technique. COMPARISON:  None Available. FINDINGS: CT HEAD FINDINGS Brain: No evidence of acute infarction, hemorrhage, hydrocephalus, extra-axial collection or mass lesion/mass effect. Periventricular and deep white matter hypodensity. Vascular: No hyperdense vessel or unexpected calcification. Skull: Normal. Negative for fracture or focal lesion. Sinuses/Orbits: No acute finding. Other: None. CT CERVICAL SPINE FINDINGS Alignment: Degenerative straightening of the normal cervical lordosis. Skull base and vertebrae: No acute fracture. No primary bone lesion or focal pathologic process.  Soft tissues and spinal canal: No prevertebral fluid or swelling. No visible canal hematoma. Disc levels: Moderate disc degenerative change of C5 through C7 with otherwise intact disc spaces. Upper chest: Negative. Other: None. IMPRESSION: 1. No acute intracranial pathology. Small-vessel white matter disease. 2. No fracture or static subluxation of the  cervical spine. 3. Moderate disc degenerative change of C5 through C7 with otherwise intact disc spaces. Electronically Signed   By: Marolyn JONETTA Jaksch M.D.   On: 05/05/2024 16:03   CT Cervical Spine Wo Contrast Result Date: 05/05/2024 CLINICAL DATA:  Head and neck trauma EXAM: CT HEAD WITHOUT CONTRAST CT CERVICAL SPINE WITHOUT CONTRAST TECHNIQUE: Multidetector CT imaging of the head and cervical spine was performed following the standard protocol without intravenous contrast. Multiplanar CT image reconstructions of the cervical spine were also generated. RADIATION DOSE REDUCTION: This exam was performed according to the departmental dose-optimization program which includes automated exposure control, adjustment of the mA and/or kV according to patient size and/or use of iterative reconstruction technique. COMPARISON:  None Available. FINDINGS: CT HEAD FINDINGS Brain: No evidence of acute infarction, hemorrhage, hydrocephalus, extra-axial collection or mass lesion/mass effect. Periventricular and deep white matter hypodensity. Vascular: No hyperdense vessel or unexpected calcification. Skull: Normal. Negative for fracture or focal lesion. Sinuses/Orbits: No acute finding. Other: None. CT CERVICAL SPINE FINDINGS Alignment: Degenerative straightening of the normal cervical lordosis. Skull base and vertebrae: No acute fracture. No primary bone lesion or focal pathologic process. Soft tissues and spinal canal: No prevertebral fluid or swelling. No visible canal hematoma. Disc levels: Moderate disc degenerative change of C5 through C7 with otherwise intact disc spaces.  Upper chest: Negative. Other: None. IMPRESSION: 1. No acute intracranial pathology. Small-vessel white matter disease. 2. No fracture or static subluxation of the cervical spine. 3. Moderate disc degenerative change of C5 through C7 with otherwise intact disc spaces. Electronically Signed   By: Marolyn JONETTA Jaksch M.D.   On: 05/05/2024 16:03    Vitals:   05/06/24 0215 05/06/24 0330 05/06/24 0500 05/06/24 0644  BP: (!) 155/91     Pulse: 87 80 83   Resp: 16 14 13    Temp:    98.3 F (36.8 C)  TempSrc:    Oral  SpO2: 92% 100% 100%   Weight:   73.9 kg   Height:   5' 6 (1.676 m)      PHYSICAL EXAM General:  Alert, well-nourished, well-developed patient in no acute distress Psych:  Mood and affect appropriate for situation CV: Regular rate and rhythm on monitor Respiratory:  Regular, unlabored respirations on room air GI: Abdomen soft and nontender   NEURO:  Mental Status: AA&Ox3, patient is able to give coherent history Speech/Language: speech is dysarthric, no aphasia.  Naming, repetition, fluency, and comprehension intact.  Cranial Nerves:  II: PERRL. Visual fields full.  III, IV, VI: EOMI. Eyelids elevate symmetrically.  V: Sensation is less to light touch to left face.  VII: Left facial droop VIII: hearing intact to voice. IX, X: Palate elevates symmetrically. Phonation is normal.  KP:Dynloizm shrug 5/5. XII: tongue is midline without fasciculations. Motor: LUE with no drift, 4+/5 strength proximally and 4/5 distally. Hand grasp 4/5 LLE 4/5 proximally, distally 3/5. Ankle 2+/5 RUE and RLE 5/5 full strength  Tone: is normal and bulk is normal Sensation- left sided numbness Coordination: FTN intact on the right HKS: no ataxia in RLE.No drift.  Gait- deferred  Most Recent NIH 7     ASSESSMENT/PLAN  Douglas Stewart is a 63 y.o. male with history of HTN, prostrate cancer, hyperlipidemia who presents with increasing falls over the last year but multiple falls over the last  day. NIH on Admission 7  Stroke:  right CR infarct, etiology: Small vessel disease Code Stroke CT head no acute abnormality CTA head & neck  Negative CTA for large vessel occlusion or other emergent finding. Mild atheromatous change about the carotid bifurcations without hemodynamically significant stenosis. MRI  2 cm acute ischemic nonhemorrhagic right basal ganglia/corona radiata infarct. 2D Echo EF 40-45%, stable from 4 months ago LDL 111 HgbA1c 6.8 UDS negative VTE prophylaxis - Lovenox No antithrombotic prior to admission, now on aspirin  81 mg daily and clopidogrel  75 mg daily for 3 weeks and then ASA alone. 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  CT head and neck unremarkable.  EF 40 to 45%, LDL 126, HgbA1c 6.4.  Discharged on aspirin  81 mg daily and clopidogrel  75 mg daily for 3 weeks and then aspirin  81mg  alone.  Also discharged on Lipitor 40.  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 MD to update note as needed.   Jorene Last, DNP, FNP-BC [facility] Pager: [phone]  ATTENDING NOTE: I reviewed above note and agree with the assessment and plan. Pt was seen and examined.   No family at bedside.  Patient lying bed, mildly lethargic, but awake, alert, eyes open, orientated to age, place, time. No  aphasia with mild dysarthria, fluent language, following all simple commands. Able to name and repeat. No gaze palsy, tracking bilaterally, visual field full. L facial droop. Tongue midline.  Right upper and lower extremity 5/5.  Left upper extremity no significant drift but bicep 4+5, tricep 4/5.  Finger grip 4/5.  Left lower extremity 4/5 proximal, ankle PF/DF 3-/5.  Sensation decreased on left face, arm and leg.  Bilateral FTN intact.  Gait not tested   For detailed assessment and plan, please refer to above as I have made changes wherever appropriate.   Neurology will sign off. Please call with questions. Pt will follow up with Dr. Margaret at [facility] in about 4 weeks. Thanks for the consult.   Ary Cummins, MD PhD Stroke Neurology 05/06/2024 6:20 PM  I spent additional inpatient 30 minutes face-to-face time with the patient and family, reviewing test results, images and medication, and discussing the diagnosis, treatment plan and potential prognosis. This patient's care requiresreview of multiple databases, neurological assessment, discussion with family, other specialists and medical decision making of high complexity.       To contact Stroke Continuity provider, please refer to [email or web address]. After hours, contact General Neurology

## 2024-05-06 NOTE — ED Notes (Signed)
 Patient transported to CT

## 2024-05-06 NOTE — ED Notes (Signed)
 Pt returned from MRI

## 2024-05-06 NOTE — Evaluation (Signed)
 Physical Therapy Evaluation Patient Details Name: Douglas Stewart MRN: [id number] DOB: October 02, 1960 Today's Date: 05/06/2024  History of Present Illness  63 y.o. male presents to [facility] hospital on 05/05/2024 with L weakness and falls. MRI reveals R basal ganglia infarct. PMH significant of prostate cancer, HTN, DM2, CVA.  Clinical Impression  Pt presents to PT with deficits in functional mobility, gait, balance, awareness, sensation and strength. Pt has a history of L weakness, although he reports increased weakness in his L side currently. Pt has difficulty managing RW due to impaired sensation and strength in LUE, often ending up outside of the walker frame and requiring PT verbal cues to correct. Pt remains at a high risk for recurrent falls due to instability and impaired awareness of balance deviations. Pt reports no caregiver support is available to assist him at the time of discharge. Patient will benefit from continued inpatient follow up therapy, <3 hours/day.      If plan is discharge home, recommend the following: A lot of help with walking and/or transfers;A lot of help with bathing/dressing/bathroom;Assistance with cooking/housework;Assist for transportation;Help with stairs or ramp for entrance;Direct supervision/assist for medications management;Direct supervision/assist for financial management   Can travel by private vehicle   Yes    Equipment Recommendations Wheelchair (measurements PT);Wheelchair cushion (measurements PT);BSC/3in1  Recommendations for Other Services       Functional Status Assessment Patient has had a recent decline in their functional status and demonstrates the ability to make significant improvements in function in a reasonable and predictable amount of time.     Precautions / Restrictions Precautions Precautions: Fall Recall of Precautions/Restrictions: Impaired Restrictions Weight Bearing Restrictions Per Provider Order: No      Mobility  Bed  Mobility Overal bed mobility: Needs Assistance Bed Mobility: Supine to Sit, Sit to Supine     Supine to sit: Min assist Sit to supine: Min assist        Transfers Overall transfer level: Needs assistance Equipment used: Rolling walker (2 wheels) Transfers: Sit to/from Stand Sit to Stand: Min assist, From elevated surface                Ambulation/Gait Ambulation/Gait assistance: Min Chemical Engineer (Feet): 75 Feet Assistive device: Rolling walker (2 wheels) Gait Pattern/deviations: Step-through pattern, Trunk flexed, Drifts right/left Gait velocity: reduced Gait velocity interpretation: <1.8 ft/sec, indicate of risk for recurrent falls   General Gait Details: pt with a tendency for leftward drift, often pt's hips drift outside of walker frame and the pt requries verbal cues to bring the walker back to base of support or to step inside the walker for improved stability.  Stairs            Wheelchair Mobility     Tilt Bed    Modified Rankin (Stroke Patients Only) Modified Rankin (Stroke Patients Only) Pre-Morbid Rankin Score: Moderately severe disability Modified Rankin: Moderately severe disability     Balance Overall balance assessment: Needs assistance Sitting-balance support: Single extremity supported, Feet supported Sitting balance-Leahy Scale: Poor     Standing balance support: Bilateral upper extremity supported, Reliant on assistive device for balance Standing balance-Leahy Scale: Poor                               Pertinent Vitals/Pain Pain Assessment Pain Assessment: No/denies pain    Home Living Family/patient expects to be discharged to:: Private residence Living Arrangements: Alone Available Help at Discharge:  (none identified  by pt) Type of Home: House Home Access: Stairs to enter Entrance Stairs-Rails: Right Entrance Stairs-Number of Steps: 4   Home Layout: One level Home Equipment: Agricultural Consultant (2  wheels)      Prior Function Prior Level of Function : Independent/Modified Independent;History of Falls (last six months)             Mobility Comments: pt reports ambulating with a RW but falling frequently       Extremity/Trunk Assessment   Upper Extremity Assessment Upper Extremity Assessment: LUE deficits/detail LUE Deficits / Details: grossly 4/5 LUE Sensation: decreased light touch LUE Coordination: decreased fine motor    Lower Extremity Assessment Lower Extremity Assessment: LLE deficits/detail LLE Deficits / Details: grossly 4/5, ROM WFL LLE Sensation: decreased light touch    Cervical / Trunk Assessment Cervical / Trunk Assessment: Normal  Communication   Communication Communication: No apparent difficulties    Cognition Arousal: Alert Behavior During Therapy: Impulsive   PT - Cognitive impairments: No family/caregiver present to determine baseline, Awareness, Safety/Judgement, Problem solving                       PT - Cognition Comments: pt with L inattention, often drifting to left side and requries cues from PT to correct. Poor retention of cues for walker management during session. Following commands: Impaired Following commands impaired: Follows one step commands with increased time, Follows multi-step commands inconsistently     Cueing Cueing Techniques: Verbal cues     General Comments General comments (skin integrity, edema, etc.): VSS on RA, mild L inattention noted    Exercises     Assessment/Plan    PT Assessment Patient needs continued PT services  PT Problem List Decreased strength;Decreased activity tolerance;Decreased balance;Decreased mobility;Decreased knowledge of use of DME;Decreased safety awareness;Decreased knowledge of precautions;Impaired sensation       PT Treatment Interventions DME instruction;Gait training;Functional mobility training;Therapeutic activities;Therapeutic exercise;Balance training;Stair  training;Neuromuscular re-education;Cognitive remediation;Patient/family education    PT Goals (Current goals can be found in the Care Plan section)  Acute Rehab PT Goals Patient Stated Goal: to improve balance and to stop falling PT Goal Formulation: With patient Time For Goal Achievement: 05/20/24 Potential to Achieve Goals: Fair    Frequency Min 2X/week     Co-evaluation               AM-PAC PT 6 Clicks Mobility  Outcome Measure Help needed turning from your back to your side while in a flat bed without using bedrails?: A Little Help needed moving from lying on your back to sitting on the side of a flat bed without using bedrails?: A Little Help needed moving to and from a bed to a chair (including a wheelchair)?: A Little Help needed standing up from a chair using your arms (e.g., wheelchair or bedside chair)?: A Little Help needed to walk in hospital room?: A Little Help needed climbing 3-5 steps with a railing? : Total 6 Click Score: 16    End of Session Equipment Utilized During Treatment: Gait belt Activity Tolerance: Patient tolerated treatment well Patient left: in bed;with call bell/phone within reach Nurse Communication: Mobility status PT Visit Diagnosis: Other abnormalities of gait and mobility (R26.89);Muscle weakness (generalized) (M62.81);Hemiplegia and hemiparesis;Other symptoms and signs involving the nervous system (R29.898) Hemiplegia - Right/Left: Left Hemiplegia - dominant/non-dominant: Non-dominant Hemiplegia - caused by: Cerebral infarction    Time: 1010-1045 PT Time Calculation (min) (ACUTE ONLY): 35 min   Charges:   PT Evaluation $PT  Eval Low Complexity: 1 Low   PT General Charges $$ ACUTE PT VISIT: 1 Visit         Bernardino JINNY Ruth, PT, DPT Acute Rehabilitation Office [phone]   Bernardino JINNY Ruth 05/06/2024, 1:09 PM

## 2024-05-06 NOTE — Progress Notes (Signed)
 Courtesy visit- No billing  Patient is seen and examined today morning. He is admitted for evaluation of left sided weakness, falls. MRI brain showed acute 2cm  infarct involving the right basal ganglion. Seems more sleepy during my exam. Echo tech at bedside. Neurology on board advised stroke work up.  Plan for full stroke work up, admission changed to full. MRI C/T/L spine reviewed shows moderate spinal stenosis. PT/ OT/ SLP eval. Further management pending clinical course.

## 2024-05-06 NOTE — H&P (Signed)
 History and Physical    Douglas Stewart FMW:[id number] DOB: Sep 26, 1960 DOA: 05/05/2024  PCP: Pcp, No   Patient coming from: Home   Chief Complaint: Left-sided weakness, falls   HPI: Douglas Stewart is a 63 y.o. male with medical history significant for hypertension, hyperlipidemia, type 2 diabetes mellitus, prostate cancer, and history of CVA who presents with left-sided weakness and falls.  Patient is a difficult historian but relates that he has had weakness involving his left leg and left arm with frequent falls over the past day.  He also reports new speech difficulty.  He denies any difficulty swallowing or change in vision.  He denies fevers, chills, or chest pain.  On ROS, patient reported new urinary incontinence and constipation.  ED Course: Upon arrival to the ED, patient is found to be afebrile and saturating well on room air with normal HR and elevated BP.  Labs are most notable for normal creatinine, glucose 197, and normal CBC.  MRI brain reveals 2 cm acute ischemic infarct involving the right basal ganglion.  CTA head and neck is negative for large vessel occlusion or hemodynamically significant stenosis.  There are no acute findings on plain radiographs of the lumbar spine.  Neurology was consulted by the ED physician and has evaluated the patient in the ED.  Review of Systems:  All other systems reviewed and apart from HPI, are negative.  Past Medical History:  Diagnosis Date   History of stroke 05/06/2024   Hypertension    Prostate cancer [facility])     Past Surgical History:  Procedure Laterality Date   no surgical history      Social History:   reports that he quit smoking about 24 years ago. He has never used smokeless tobacco. He reports that he does not drink alcohol and does not use drugs.  No Known Allergies  Family History  Problem Relation Age of Onset   Stroke Neg Hx      Prior to Admission medications   Medication Sig Start Date End Date Taking?  Authorizing Provider  atorvastatin  (LIPITOR) 40 MG tablet Take 1 tablet (40 mg total) by mouth daily. 04/03/24 07/27/24 Yes Penumalli, Eduard SAUNDERS, MD  lisinopril  (ZESTRIL ) 10 MG tablet Take 1 tablet (10 mg total) by mouth daily. 04/03/24 07/27/24 Yes Margaret Eduard SAUNDERS, MD    Physical Exam: Vitals:   05/06/24 0147 05/06/24 0215 05/06/24 0330 05/06/24 0500  BP: (!) 148/107 (!) 155/91    Pulse: 88 87 80 83  Resp: 16 16 14 13   Temp: 98.4 F (36.9 C)     TempSrc: Oral     SpO2: 100% 92% 100% 100%  Weight:    73.9 kg  Height:    5' 6 (1.676 m)    Constitutional: NAD, no pallor or diaphoresis   Eyes: PERTLA, lids and conjunctivae normal ENMT: Mucous membranes are moist. Posterior pharynx clear of any exudate or lesions.   Neck: supple, no masses  Respiratory: no wheezing, no crackles. No accessory muscle use.  Cardiovascular: S1 & S2 heard, regular rate and rhythm. No extremity edema.  Abdomen: No tenderness, soft. Bowel sounds active.  Musculoskeletal: no clubbing / cyanosis. No joint deformity upper and lower extremities.   Skin: no significant rashes, lesions, ulcers. Warm, dry, well-perfused. Neurologic: Left lower facial weakness. Dysarthria. Sensation of light touch diminished in LUE and LLE. Strength 5/5 throughout right arm and 4/5 involving LUE and b/l LEs. Alert and oriented to person, place, and situation.  Psychiatric: Calm.  Cooperative.    Labs and Imaging on Admission: I have personally reviewed following labs and imaging studies  CBC: Recent Labs  Lab 05/05/24 1530  WBC 5.6  NEUTROABS 3.4  HGB 13.7  HCT 40.4  MCV 88.4  PLT 246   Basic Metabolic Panel: Recent Labs  Lab 05/05/24 1530  NA 138  K 3.9  CL 100  CO2 23  GLUCOSE 187*  BUN 12  CREATININE 0.75  CALCIUM  9.5   GFR: Estimated Creatinine Clearance: 84.2 mL/min (by C-G formula based on SCr of 0.75 mg/dL). Liver Function Tests: No results for input(s): AST, ALT, ALKPHOS, BILITOT, PROT,  ALBUMIN in the last 168 hours. No results for input(s): LIPASE, AMYLASE in the last 168 hours. No results for input(s): AMMONIA in the last 168 hours. Coagulation Profile: No results for input(s): INR, PROTIME in the last 168 hours. Cardiac Enzymes: No results for input(s): CKTOTAL, CKMB, CKMBINDEX, TROPONINI in the last 168 hours. BNP (last 3 results) No results for input(s): PROBNP in the last 8760 hours. HbA1C: No results for input(s): HGBA1C in the last 72 hours. CBG: No results for input(s): GLUCAP in the last 168 hours. Lipid Profile: No results for input(s): CHOL, HDL, LDLCALC, TRIG, CHOLHDL, LDLDIRECT in the last 72 hours. Thyroid  Function Tests: No results for input(s): TSH, T4TOTAL, FREET4, T3FREE, THYROIDAB in the last 72 hours. Anemia Panel: No results for input(s): VITAMINB12, FOLATE, FERRITIN, TIBC, IRON, RETICCTPCT in the last 72 hours. Urine analysis:    Component Value Date/Time   COLORURINE YELLOW 05/05/2024 1520   APPEARANCEUR CLEAR 05/05/2024 1520   LABSPEC 1.023 05/05/2024 1520   PHURINE 5.0 05/05/2024 1520   GLUCOSEU NEGATIVE 05/05/2024 1520   HGBUR NEGATIVE 05/05/2024 1520   BILIRUBINUR NEGATIVE 05/05/2024 1520   KETONESUR NEGATIVE 05/05/2024 1520   PROTEINUR 30 (A) 05/05/2024 1520   NITRITE NEGATIVE 05/05/2024 1520   LEUKOCYTESUR NEGATIVE 05/05/2024 1520   Sepsis Labs: @LABRCNTIP (procalcitonin:4,lacticidven:4) )No results found for this or any previous visit (from the past 240 hours).   Radiological Exams on Admission: CT ANGIO HEAD NECK W WO CM Result Date: 05/06/2024 CLINICAL DATA:  Initial evaluation for acute neuro deficit, stroke. EXAM: CT ANGIOGRAPHY HEAD AND NECK WITH AND WITHOUT CONTRAST TECHNIQUE: Multidetector CT imaging of the head and neck was performed using the standard protocol during bolus administration of intravenous contrast. Multiplanar CT image reconstructions and MIPs  were obtained to evaluate the vascular anatomy. Carotid stenosis measurements (when applicable) are obtained utilizing NASCET criteria, using the distal internal carotid diameter as the denominator. RADIATION DOSE REDUCTION: This exam was performed according to the departmental dose-optimization program which includes automated exposure control, adjustment of the mA and/or kV according to patient size and/or use of iterative reconstruction technique. CONTRAST:  75mL OMNIPAQUE  IOHEXOL  350 MG/ML SOLN COMPARISON:  Comparison made with MRI from earlier the same day as well as prior exam from 01/12/2024. FINDINGS: CTA NECK FINDINGS Aortic arch: Aortic arch within normal limits for caliber with standard branch pattern. Mild aortic atherosclerosis. No stenosis about the origin of the great vessels. Right carotid system: Right common and internal carotid arteries are patent without dissection. Mild atheromatous irregularity about the l right carotid bulb without hemodynamically significant stenosis. Left carotid system: Left common and internal carotid arteries are patent without dissection. Eccentric mixed plaque about the left carotid bulb without hemodynamically significant greater than 50% stenosis. Vertebral arteries: Left vertebral artery slightly dominant. Vertebral arteries patent without stenosis or dissection. Skeleton: No worrisome osseous lesions. Moderate spondylosis present  at C5-6 and C6-7. Other neck: No other acute finding. Upper chest: No other acute finding. Review of the MIP images confirms the above findings CTA HEAD FINDINGS Anterior circulation: Both internal carotid arteries widely patent to the termini without stenosis. A1 segments widely patent. Normal anterior communicating artery complex. Both anterior cerebral arteries widely patent to their distal aspects without stenosis. No M1 stenosis or occlusion. Normal MCA bifurcations. Distal MCA branches well perfused and symmetric. Posterior  circulation: Both V4 segments patent without stenosis. Left PICA patent. Right PICA not well seen. Basilar patent without stenosis. Superior cerebral arteries patent bilaterally. Right PCA supplied via the basilar. Predominant fetal type origin left PCA. Both PCAs patent without stenosis. Venous sinuses: Patent allowing for timing the contrast bolus. Anatomic variants: As above.  No aneurysm. Review of the MIP images confirms the above findings IMPRESSION: 1. Negative CTA for large vessel occlusion or other emergent finding. 2. Mild atheromatous change about the carotid bifurcations without hemodynamically significant stenosis. Aortic Atherosclerosis (ICD10-I70.0). Electronically Signed   By: Morene Hoard M.D.   On: 05/06/2024 04:35   MR BRAIN WO CONTRAST Result Date: 05/06/2024 CLINICAL DATA:  Initial evaluation for multiple falls, history of stroke. EXAM: MRI HEAD WITHOUT CONTRAST TECHNIQUE: Multiplanar, multiecho pulse sequences of the brain and surrounding structures were obtained without intravenous contrast. COMPARISON:  ET from 05/05/2024. FINDINGS: Brain: Age-related cerebral atrophy. Patchy T2/FLAIR hyperintensity involving the supratentorial cerebral white matter, consistent with chronic small vessel ischemic disease. 2 cm acute ischemic nonhemorrhagic infarcts seen involving the right basal ganglia of/corona radiata (series 5, image 83). No significant mass effect. Gray-white matter differentiation otherwise maintained. No acute or significant chronic intracranial blood products. No mass lesion, midline shift or mass effect. Ventricular prominence related global parenchymal volume loss without hydrocephalus. No extra-axial fluid collection. Pituitary gland within normal limits. Vascular: Major intracranial vascular flow voids are maintained. Skull and upper cervical spine: Craniocervical junction within normal limits. Bone marrow signal intensity normal. No scalp soft tissue abnormality.  Sinuses/Orbits: Prior bilateral ocular lens replacement. Scattered mucosal thickening noted about the ethmoidal air cells and maxillary sinuses. Few scattered superimposed left maxillary sinus retention cyst noted. No significant mastoid effusion. Other: None. IMPRESSION: 1. 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 2. Underlying age-related cerebral atrophy with chronic small vessel ischemic disease. Electronically Signed   By: Morene Hoard M.D.   On: 05/06/2024 01:08   DG Lumbar Spine Complete Result Date: 05/05/2024 EXAM: 4 OR MORE VIEW(S) XRAY OF THE LUMBAR SPINE 05/05/2024 03:57:00 PM COMPARISON: Lumbar spine x-ray from 11/12/2017. CLINICAL HISTORY: falls FINDINGS: LUMBAR SPINE: BONES: No acute fracture. No aggressive appearing osseous lesion. Alignment is normal. Prostate radiotherapy seeds are present. DISCS AND DEGENERATIVE CHANGES: Moderate degenerative changes at L4-L5 with mild disc space narrowing, vacuum disc phenomenon and endplate sclerosis which has increased compared to 2019. SOFT TISSUES: No acute abnormality. IMPRESSION: 1. No acute abnormality of the lumbar spine related to the falls. 2. Moderate degenerative changes at L4-L5 have increased compared to 2019. Electronically signed by: Greig Pique MD 05/05/2024 04:26 PM EST RP Workstation: [id number]   CT Head Wo Contrast Result Date: 05/05/2024 CLINICAL DATA:  Head and neck trauma EXAM: CT HEAD WITHOUT CONTRAST CT CERVICAL SPINE WITHOUT CONTRAST TECHNIQUE: Multidetector CT imaging of the head and cervical spine was performed following the standard protocol without intravenous contrast. Multiplanar CT image reconstructions of the cervical spine were also generated. RADIATION DOSE REDUCTION: This exam was performed according to the departmental dose-optimization  program which includes automated exposure control, adjustment of the mA and/or kV according to patient size and/or use of iterative  reconstruction technique. COMPARISON:  None Available. FINDINGS: CT HEAD FINDINGS Brain: No evidence of acute infarction, hemorrhage, hydrocephalus, extra-axial collection or mass lesion/mass effect. Periventricular and deep white matter hypodensity. Vascular: No hyperdense vessel or unexpected calcification. Skull: Normal. Negative for fracture or focal lesion. Sinuses/Orbits: No acute finding. Other: None. CT CERVICAL SPINE FINDINGS Alignment: Degenerative straightening of the normal cervical lordosis. Skull base and vertebrae: No acute fracture. No primary bone lesion or focal pathologic process. Soft tissues and spinal canal: No prevertebral fluid or swelling. No visible canal hematoma. Disc levels: Moderate disc degenerative change of C5 through C7 with otherwise intact disc spaces. Upper chest: Negative. Other: None. IMPRESSION: 1. No acute intracranial pathology. Small-vessel white matter disease. 2. No fracture or static subluxation of the cervical spine. 3. Moderate disc degenerative change of C5 through C7 with otherwise intact disc spaces. Electronically Signed   By: Marolyn JONETTA Jaksch M.D.   On: 05/05/2024 16:03   CT Cervical Spine Wo Contrast Result Date: 05/05/2024 CLINICAL DATA:  Head and neck trauma EXAM: CT HEAD WITHOUT CONTRAST CT CERVICAL SPINE WITHOUT CONTRAST TECHNIQUE: Multidetector CT imaging of the head and cervical spine was performed following the standard protocol without intravenous contrast. Multiplanar CT image reconstructions of the cervical spine were also generated. RADIATION DOSE REDUCTION: This exam was performed according to the departmental dose-optimization program which includes automated exposure control, adjustment of the mA and/or kV according to patient size and/or use of iterative reconstruction technique. COMPARISON:  None Available. FINDINGS: CT HEAD FINDINGS Brain: No evidence of acute infarction, hemorrhage, hydrocephalus, extra-axial collection or mass lesion/mass  effect. Periventricular and deep white matter hypodensity. Vascular: No hyperdense vessel or unexpected calcification. Skull: Normal. Negative for fracture or focal lesion. Sinuses/Orbits: No acute finding. Other: None. CT CERVICAL SPINE FINDINGS Alignment: Degenerative straightening of the normal cervical lordosis. Skull base and vertebrae: No acute fracture. No primary bone lesion or focal pathologic process. Soft tissues and spinal canal: No prevertebral fluid or swelling. No visible canal hematoma. Disc levels: Moderate disc degenerative change of C5 through C7 with otherwise intact disc spaces. Upper chest: Negative. Other: None. IMPRESSION: 1. No acute intracranial pathology. Small-vessel white matter disease. 2. No fracture or static subluxation of the cervical spine. 3. Moderate disc degenerative change of C5 through C7 with otherwise intact disc spaces. Electronically Signed   By: Marolyn JONETTA Jaksch M.D.   On: 05/05/2024 16:03    EKG: Independently reviewed. Sinus rhythm.   Assessment/Plan   1. Acute ischemic CVA  - Keep npo pending swallow screen, continue neuro checks and cardiac monitoring, check lipids and A1c, start ASA 81 mg daily, hold off on Plavix  pending MRIs, consult PT/OT/SLP     2. Hyperlipidemia  - Continue Lipitor    3. Type II DM  - Update A1c, use low-intensity SSI for now    4. HTN  - Hold lisinopril  in acute phase of ischemic CVA    5. Urinary incontinence; hx of spinal stenosis  - MRI T-, C-, and L-spine ordered by neurology    DVT prophylaxis: Lovenox  Code Status: Full  Level of Care: Level of care: Telemetry Family Communication: none present  Disposition Plan:  Patient is from: home  Anticipated d/c is to: TBD Anticipated d/c date is: 05/07/24  Patient currently: Pending MRI spine, therapy assessments  Consults called: Neurology  Admission status: Observation  Evalene GORMAN Sprinkles, MD [facility]  05/06/2024, 6:13 AM

## 2024-05-06 NOTE — Evaluation (Signed)
 Occupational Therapy Evaluation Patient Details Name: Douglas Stewart MRN: [id number] DOB: [birth year] Today's Date: 05/06/2024   History of Present Illness   63 y.o. male presents to [facility] hospital on 05/05/2024 with L weakness and falls. MRI reveals R basal ganglia infarct. PMH significant of prostate cancer, HTN, DM2, CVA.     Clinical Impressions Patient admitted for the diagnosis above.  PTA patient walked with a RW, and self reports independence with ADL and light iADL.  Deficits listed below.  Currently needing up to Max A for lower body ADL from a sit to stand level, Mod A for upper body ADL, up to Mod A for stand pivot transfer.  OT will continue efforts in the acute setting to address deficits and Patient will benefit from continued inpatient follow up therapy, <3 hours/day.     If plan is discharge home, recommend the following:   Assist for transportation;A lot of help with bathing/dressing/bathroom;A lot of help with walking and/or transfers     Functional Status Assessment   Patient has had a recent decline in their functional status and demonstrates the ability to make significant improvements in function in a reasonable and predictable amount of time.     Equipment Recommendations   BSC/3in1;Tub/shower seat;Wheelchair (measurements OT);Wheelchair cushion (measurements OT)     Recommendations for Other Services         Precautions/Restrictions   Precautions Precautions: Fall Recall of Precautions/Restrictions: Impaired Restrictions Weight Bearing Restrictions Per Provider Order: No     Mobility Bed Mobility Overal bed mobility: Needs Assistance Bed Mobility: Supine to Sit, Sit to Supine     Supine to sit: Min assist Sit to supine: Min assist        Transfers Overall transfer level: Needs assistance Equipment used: 1 person hand held assist Transfers: Sit to/from Stand, Bed to chair/wheelchair/BSC Sit to Stand: Min assist     Step pivot  transfers: Mod assist            Balance Overall balance assessment: Needs assistance Sitting-balance support: Single extremity supported, Feet supported Sitting balance-Leahy Scale: Fair     Standing balance support: Bilateral upper extremity supported, Reliant on assistive device for balance Standing balance-Leahy Scale: Poor                             ADL either performed or assessed with clinical judgement   ADL Overall ADL's : Needs assistance/impaired Eating/Feeding: Set up;Bed level   Grooming: Wash/dry hands;Wash/dry face;Set up;Bed level   Upper Body Bathing: Minimal assistance;Bed level   Lower Body Bathing: Moderate assistance;Maximal assistance;Sit to/from stand   Upper Body Dressing : Moderate assistance;Sitting   Lower Body Dressing: Maximal assistance;Sit to/from stand   Toilet Transfer: Moderate assistance;Stand-pivot;BSC/3in1   Toileting- Clothing Manipulation and Hygiene: Maximal assistance;Sit to/from stand               Vision   Vision Assessment?: No apparent visual deficits     Perception Perception: Within Functional Limits       Praxis Praxis: WFL       Pertinent Vitals/Pain Pain Assessment Pain Assessment: No/denies pain     Extremity/Trunk Assessment Upper Extremity Assessment Upper Extremity Assessment: Right hand dominant LUE Deficits / Details: functional strength, but when not attending to L hand, drags behind with weak grip.  Flexed posturing LUE Sensation: decreased light touch LUE Coordination: decreased fine motor   Lower Extremity Assessment Lower Extremity Assessment: Defer to PT evaluation  LLE Deficits / Details: grossly 4/5, ROM WFL LLE Sensation: decreased light touch   Cervical / Trunk Assessment Cervical / Trunk Assessment: Normal   Communication Communication Communication: No apparent difficulties Factors Affecting Communication: Difficulty expressing self;Reduced clarity of speech    Cognition Arousal: Alert Behavior During Therapy: Impulsive Cognition: No family/caregiver present to determine baseline                               Following commands: Impaired Following commands impaired: Follows one step commands with increased time, Follows multi-step commands inconsistently     Cueing  General Comments   Cueing Techniques: Verbal cues  VSS on RA, mild L inattention noted   Exercises     Shoulder Instructions      Home Living Family/patient expects to be discharged to:: Private residence Living Arrangements: Alone Available Help at Discharge:  (none identified by pt) Type of Home: House Home Access: Stairs to enter [employer] of Steps: 4 Entrance Stairs-Rails: Right Home Layout: One level     Bathroom Shower/Tub: Sponge bathes at baseline   Bathroom Toilet: Standard     Home Equipment: Agricultural [employer] (2 wheels)   Additional Comments: has been staying with friend      Prior Functioning/Environment Prior Level of Function : Independent/Modified Independent;History of Falls (last six months)             Mobility Comments: pt reports ambulating with a RW but falling frequently      OT Problem List: Decreased strength;Decreased activity tolerance;Impaired balance (sitting and/or standing);Decreased safety awareness;Impaired sensation;Impaired UE functional use   OT Treatment/Interventions: Self-care/ADL training;Therapeutic activities;Patient/family education;DME and/or AE instruction;Balance training      OT Goals(Current goals can be found in the care plan section)   Acute Rehab OT Goals Patient Stated Goal: Return home OT Goal Formulation: With patient Time For Goal Achievement: 05/20/24 Potential to Achieve Goals: Good ADL Goals Pt Will Perform Grooming: with set-up;sitting Pt Will Perform Upper Body Bathing: with set-up;sitting Pt Will Perform Lower Body Bathing: with min assist;sit to/from  stand Pt Will Perform Upper Body Dressing: with set-up;sitting Pt Will Perform Lower Body Dressing: with min assist;sit to/from stand Pt Will Transfer to Toilet: with supervision;ambulating;regular height toilet   OT Frequency:  Min 2X/week    Co-evaluation              AM-PAC OT 6 Clicks Daily Activity     Outcome Measure Help from another person eating meals?: A Little Help from another person taking care of personal grooming?: A Little Help from another person toileting, which includes using toliet, bedpan, or urinal?: A Lot Help from another person bathing (including washing, rinsing, drying)?: A Lot Help from another person to put on and taking off regular upper body clothing?: A Lot Help from another person to put on and taking off regular lower body clothing?: A Lot 6 Click Score: 14   End of Session Equipment Utilized During Treatment: Gait belt Nurse Communication: Mobility status  Activity Tolerance: Patient tolerated treatment well Patient left: in bed;with call bell/phone within reach  OT Visit Diagnosis: Unsteadiness on feet (R26.81);Muscle weakness (generalized) (M62.81);History of falling (Z91.81)                Time: 8688-8664 OT Time Calculation (min): 24 min Charges:  OT General Charges $OT Visit: 1 Visit OT Evaluation $OT Eval Moderate Complexity: 1 Mod OT Treatments $Self Care/Home Management : 8-22 mins  05/06/2024  RP, OTR/L  Acute Rehabilitation Services  Office:  [phone]   Douglas Stewart 05/06/2024, 1:56 PM

## 2024-05-06 NOTE — Consult Note (Addendum)
 NEUROLOGY CONSULT NOTE   Date of service: May 06, 2024 Patient Name: Douglas Stewart MRN:  [id number] DOB:  17-Jul-1960 Chief Complaint: L sided weakness Requesting Provider: Melvenia Motto, MD  History of Present Illness  Douglas Stewart is a 63 y.o. male with hx of HTN, prostrate cancer, hyperlipidemia who presents with increasing falls over the last year but multiplefalls over the last day.  He feels that his left leg has been giving out and he has been dragging his left foot.  He came to the ED for further evaluation and he had an MRI of the brain without contrast which demonstrated a 2 cm right basal ganglia/corona radiata infarct.  Patient is an extremely poor historian and is unable to provide a clear timeline of his symptoms.  He reports that at baseline, he has been using a walker at his home to walk around.  He has been struggling with that for the last day specifically, he noticed that he was worse last night.  He denies any prior history of strokes despite just being diagnosed with a stroke back in July 2025.  He is not on aspirin  for secondary stroke prophylaxis.  He reports both his legs are clumsy and hard to control at baseline. Now can't tell when he needs to pee.  LKW: Unclear but I presume evening of 05/04/24. Modified rankin score: 3-Moderate disability-requires help but walks WITHOUT assistance IV Thrombolysis: Not offered, outside of window.   EVT: Not offered, no LVO.    NIHSS components Score: Comment  1a Level of Conscious 0[x]  1[]  2[]  3[]      1b LOC Questions 0[x]  1[]  2[]       1c LOC Commands 0[x]  1[]  2[]       2 Best Gaze 0[x]  1[]  2[]       3 Visual 0[x]  1[]  2[]  3[]      4 Facial Palsy 0[]  1[]  2[x]  3[]      5a Motor Arm - left 0[x]  1[]  2[]  3[]  4[]  UN[]    5b Motor Arm - Right 0[x]  1[]  2[]  3[]  4[]  UN[]    6a Motor Leg - Left 0[]  1[x]  2[]  3[]  4[]  UN[]    6b Motor Leg - Right 0[x]  1[]  2[]  3[]  4[]  UN[]    7 Limb Ataxia 0[x]  1[]  2[]  UN[]      8 Sensory 0[]  1[]  2[x]   UN[]      9 Best Language 0[x]  1[]  2[]  3[]      10 Dysarthria 0[]  1[]  2[x]  UN[]      11 Extinct. and Inattention 0[x]  1[]  2[]       TOTAL: 7      ROS  Comprehensive ROS performed and pertinent positives documented in HPI   Past History   Past Medical History:  Diagnosis Date   Hypertension    Prostate cancer [facility])     Past Surgical History:  Procedure Laterality Date   no surgical history      Family History: Family History  Problem Relation Age of Onset   Stroke Neg Hx     Social History  reports that he quit smoking about 24 years ago. He has never used smokeless tobacco. He reports that he does not drink alcohol and does not use drugs.  No Known Allergies  Medications  No current facility-administered medications for this encounter.  Current Outpatient Medications:    atorvastatin  (LIPITOR) 40 MG tablet, Take 1 tablet (40 mg total) by mouth daily., Disp: 90 tablet, Rfl: 0   lisinopril  (ZESTRIL ) 10 MG tablet, Take 1 tablet (10 mg total)  by mouth daily., Disp: 90 tablet, Rfl: 0  Vitals   Vitals:   27-May-2024 2345 05/06/24 0034 05/06/24 0045 05/06/24 0147  BP: 134/80 (!) 150/93 (!) 148/107 (!) 148/107  Pulse: 89 86 88 88  Resp:  16  16  Temp:  99.1 F (37.3 C)  98.4 F (36.9 C)  TempSrc:  Oral  Oral  SpO2: 100% 100% 100% 100%  Height:        Body mass index is 26.31 kg/m.   Physical Exam   General: Laying comfortably in bed; in no acute distress.  HENT: Normal oropharynx and mucosa. Normal external appearance of ears and nose.  Neck: Supple, no pain or tenderness  CV: No JVD. No peripheral edema.  Pulmonary: Symmetric Chest rise. Normal respiratory effort.  Abdomen: Soft to touch, non-tender.  Ext: No cyanosis, edema, or deformity  Skin: No rash. Normal palpation of skin.   Musculoskeletal: Normal digits and nails by inspection. No clubbing.   Neurologic Examination  Mental status/Cognition: Alert, oriented to self, place, month and year, good  attention.  Speech/language: Bradyphrenic, dysarthric speech, fluent, comprehension intact, object naming intact, repetition intact.  Cranial nerves:   CN II Pupils equal and reactive to light, no VF deficits    CN III,IV,VI EOM intact, no gaze preference or deviation, no nystagmus    CN V normal sensation in V1, V2, and V3 segments bilaterally    CN VII Left facial droop.   CN VIII normal hearing to speech    CN IX & X normal palatal elevation, no uvular deviation    CN XI 5/5 head turn and 5/5 shoulder shrug bilaterally    CN XII midline tongue protrusion    Motor:  Muscle bulk: Normal, tone normal. Mvmt Root Nerve  Muscle Right Left Comments  SA C5/6 Ax Deltoid 5 4   EF C5/6 Mc Biceps 5 4   EE C6/7/8 Rad Triceps 5 4   WF C6/7 Med FCR     WE C7/8 PIN ECU     F Ab C8/T1 U ADM/FDI 5 4   HF L1/2/3 Fem Illopsoas 4+ 4   KE L2/3/4 Fem Quad     DF L4/5 D Peron Tib Ant 4 3   PF S1/2 Tibial Grc/Sol 4 4    Reflexes:  Right Left Comments  Pectoralis      Biceps (C5/6) 2 2   Brachioradialis (C5/6) 2 2    Triceps (C6/7) 2 2    Patellar (L3/4) 3 3    Achilles (S1) 2 2    Hoffman      Plantar Withdraws Withdraws   Jaw jerk    Sensation:  Light touch Decreased in left upper extremity and left lower extremity to light touch.   Pin prick    Temperature    Vibration   Proprioception    Coordination/Complex Motor:  - Finger to Nose intact bilaterally - Heel to shin unable to do. - Rapid alternating movement are slow throughout with left worse than right - Gait: Deferred for patient's safety. Labs/Imaging/Neurodiagnostic studies   CBC:  Recent Labs  Lab 2024-05-27 1530  WBC 5.6  NEUTROABS 3.4  HGB 13.7  HCT 40.4  MCV 88.4  PLT 246   Basic Metabolic Panel:  Lab Results  Component Value Date   NA 138 27-May-2024   K 3.9 05/27/24   CO2 23 05-27-24   GLUCOSE 187 (H) 27-May-2024   BUN 12 05-27-2024   CREATININE 0.75 05/27/24   CALCIUM  9.5  05/05/2024   GFRNONAA >60  05/05/2024   GFRAA >60 01/27/2018   Lipid Panel:  Lab Results  Component Value Date   LDLCALC 126 (H) 01/13/2024   HgbA1c:  Lab Results  Component Value Date   HGBA1C 6.4 (H) 01/12/2024   Urine Drug Screen: No results found for: LABOPIA, COCAINSCRNUR, LABBENZ, AMPHETMU, THCU, LABBARB  Alcohol Level     Component Value Date/Time   ETH <15 01/13/2024 0221   INR  Lab Results  Component Value Date   INR 1.0 01/12/2024   APTT No results found for: APTT AED levels: No results found for: PHENYTOIN, ZONISAMIDE, LAMOTRIGINE, LEVETIRACETA  CT angio Head and Neck with contrast(Personally reviewed): 1. Negative CTA for large vessel occlusion or other emergent finding. 2. Mild atheromatous change about the carotid bifurcations without hemodynamically significant stenosis.  MRI Brain(Personally reviewed): 2 cm acute ischemic nonhemorrhagic right basal ganglia/corona radiata infarct.  ASSESSMENT   Douglas Stewart is a 63 y.o. male with hx of HTN, prostrate cancer, hyperlipidemia who presents with increasing falls over the last year but multiplefalls over the last day.  He feels that his left leg has been giving out and he has been dragging his left foot.  Neuroexam with left facial droop, dysarthric speech, left arm weakness and left worse than right bilateral lower extremity weakness.  MRI of the brain demonstrates a 2 cm acute ischemic right basal ganglia infarct.  In addition, he is also endorsing urinary incontinence and constipation now.  My concern is that in addition to the stroke, he possibly could have had a progression of the noted multilevel spinal stenosis in the lumbar spine that may be contributing.  RECOMMENDATIONS  - Frequent Neuro checks per stroke unit protocol - no need for TTE since he had one in July of this year - Recommend obtaining Lipid panel with LDL - Please start statin if LDL > 70 - Recommend HbA1c to evaluate for diabetes and how  well it is controlled. - Antithrombotic -aspirin  81 mg daily for now . I dont want to add plavix  for now, in case if he has any cord compression. - Recommend DVT ppx - SBP goal - aim for gradual normotension - Recommend Telemetry monitoring for arrythmia - Recommend bedside swallow screen prior to PO intake. - Stroke education booklet - Recommend PT/OT/SLP consult   MRI T and L spine without contrast in addition to above for reported urinary incontinence, increasing frequency of falls.  ______________________________________________________________________  Plan discussed with patient and with Dr. Melvenia with the ED team.  I personally spent a total of 75 minutes in the care of the patient today including preparing to see the patient, getting/reviewing separately obtained history, performing a medically appropriate exam/evaluation, counseling and educating, placing orders, referring and communicating with other health care professionals, documenting clinical information in the EHR, independently interpreting results, communicating results, and coordinating care.   Signed, Carmie Lanpher, MD [facility]

## 2024-05-06 NOTE — ED Provider Notes (Signed)
 Care of patient assumed from Dr. Elnor.  This patient presented for increasing frequency of falls.  He has fallen multiple times over the past 24 hours.  He attributes this to weakness.  He did have a similar presentation in July and was found to have CVA at the time.  He is currently awaiting MRI results. Physical Exam  BP 119/73 (BP Location: Left Arm)   Pulse 84   Temp 98.9 F (37.2 C) (Oral)   Resp 18   Ht 5' 6 (1.676 m)   SpO2 99%   BMI 26.31 kg/m   Physical Exam Vitals and nursing note reviewed.  Constitutional:      General: He is not in acute distress.    Appearance: Normal appearance. He is well-developed. He is not ill-appearing, toxic-appearing or diaphoretic.  HENT:     Head: Normocephalic and atraumatic.     Right Ear: External ear normal.     Left Ear: External ear normal.     Nose: Nose normal.     Mouth/Throat:     Mouth: Mucous membranes are moist.  Eyes:     Extraocular Movements: Extraocular movements intact.     Conjunctiva/sclera: Conjunctivae normal.  Cardiovascular:     Rate and Rhythm: Normal rate and regular rhythm.  Pulmonary:     Effort: Pulmonary effort is normal. No respiratory distress.  Abdominal:     General: There is no distension.     Palpations: Abdomen is soft.     Tenderness: There is no abdominal tenderness.  Musculoskeletal:        General: No swelling or deformity.     Cervical back: Normal range of motion and neck supple.  Skin:    General: Skin is warm and dry.     Coloration: Skin is not jaundiced or pale.  Neurological:     Mental Status: He is alert and oriented to person, place, and time.     Cranial Nerves: Facial asymmetry present.     Sensory: Sensory deficit present.     Motor: Weakness present.     Coordination: Finger-Nose-Finger Test abnormal.  Psychiatric:        Mood and Affect: Mood normal.        Behavior: Behavior normal.     Procedures  Procedures  ED Course / MDM    Medical Decision Making Amount  and/or Complexity of Data Reviewed Radiology: ordered.  Risk Prescription drug management.   Shows acute right basal ganglia/corona radiata infarct.  On exam, patient does have mild left hemibody weakness, mild left hemibody diminished sensation.  He has mild dysmetria with his left upper extremity.  He does have a left-sided facial droop.  Patient reports that these are new symptoms since his prior stroke.  Per chart review, he was prescribed aspirin  and Plavix  at time of his last stroke with instruction to discontinue Plavix  after 3 weeks.  Patient reports that he is not currently taking any antiplatelet medications.  I spoke with neurologist on-call, Dr. Vanessa, who evaluated the patient.  He does recommend a medicine admission.  He also recommends MRI of lumbar spine for further evaluation of known spinal stenosis which may be contributing to lower extremity weakness.  This was ordered.  Patient was admitted for further management.       Melvenia Motto, MD 05/06/24 [phone]

## 2024-05-06 NOTE — ED Notes (Signed)
 Neuro at bedside, now, pt at bedside

## 2024-05-07 DIAGNOSIS — I10 Essential (primary) hypertension: Secondary | ICD-10-CM | POA: Diagnosis not present

## 2024-05-07 DIAGNOSIS — E119 Type 2 diabetes mellitus without complications: Secondary | ICD-10-CM | POA: Diagnosis not present

## 2024-05-07 DIAGNOSIS — E78019 Familial hypercholesterolemia, unspecified: Secondary | ICD-10-CM

## 2024-05-07 DIAGNOSIS — I639 Cerebral infarction, unspecified: Secondary | ICD-10-CM | POA: Diagnosis not present

## 2024-05-07 LAB — GLUCOSE, CAPILLARY
Glucose-Capillary: 192 mg/dL — ABNORMAL HIGH (ref 70–99)
Glucose-Capillary: 171 mg/dL — ABNORMAL HIGH (ref 70–99)
Glucose-Capillary: 255 mg/dL — ABNORMAL HIGH (ref 70–99)
Glucose-Capillary: 235 mg/dL — ABNORMAL HIGH (ref 70–99)

## 2024-05-07 LAB — BASIC METABOLIC PANEL WITH GFR
Anion gap: 11 (ref 5–15)
BUN: 14 mg/dL (ref 8–23)
CO2: 24 mmol/L (ref 22–32)
Calcium: 9 mg/dL (ref 8.9–10.3)
Chloride: 102 mmol/L (ref 98–111)
Creatinine, Ser: 0.91 mg/dL (ref 0.61–1.24)
GFR, Estimated: >60 mL/min (ref >60)
Glucose, Bld: 185 mg/dL — ABNORMAL HIGH (ref 70–99)
Potassium: 3.9 mmol/L (ref 3.5–5.1)
Sodium: 137 mmol/L (ref 135–145)

## 2024-05-07 LAB — CBC
HCT: 38.4 % — ABNORMAL LOW (ref 39.0–52.0)
Hemoglobin: 12.9 g/dL — ABNORMAL LOW (ref 13.0–17.0)
MCH: 30.1 pg (ref 26.0–34.0)
MCHC: 33.6 g/dL (ref 30.0–36.0)
MCV: 89.7 fL (ref 80.0–100.0)
Platelets: 230 K/uL (ref 150–400)
RBC: 4.28 MIL/uL (ref 4.22–5.81)
RDW: 12.3 % (ref 11.5–15.5)
WBC: 3.5 K/uL — ABNORMAL LOW (ref 4.0–10.5)
nRBC: 0 % (ref 0.0–0.2)

## 2024-05-07 NOTE — Progress Notes (Signed)
   05/07/24 1833  Spiritual Encounters  Type of Visit Initial  Care provided to: Patient  Reason for visit Routine spiritual support  OnCall Visit Yes  Spiritual Framework  Presenting Themes Impactful experiences and emotions  Patient Stress Factors Health changes  Family Stress Factors None identified  Interventions  Spiritual Care Interventions Made Prayer;Compassionate presence;Established relationship of care and support  Intervention Outcomes  Outcomes Awareness of support   Chaplain stopped by to visit Pt, who was preparing to have dinner. Chaplain introduced himself an reminded the Pt that chaplain services are available upon request. A prayer was offered per the Pt's consent. The Pt expressed gratitude for the visit.

## 2024-05-07 NOTE — Evaluation (Signed)
 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 male who presented to ED on 05/06/24 with left sided weakness. MRI: 2 cm acute infarct right basal ganglia/corona radiata. PMHx prostate ca, HTN, DM2, prior CVA 12/2023 left posterior frontal lobe with no language deficits, mildly impaired exec functioning and organization of thoughts.   Assessment / Plan / Recommendation Clinical Impression  Pt presents with Right Hemisphere Communication Disorder c/b inattention to left, impaired sustained attention, decreased working memory and problem-solving reflected during menu reading and food-ordering task. He has adequate intellectual awareness of physical impairments, but limited awareness of cognitive processing deficits.  Speech is dysarthric with central CN deficits of V and VII on left. Pt will benefit from acute care SLP intervention while admitted and in next venue of care.    SLP Assessment  SLP Recommendation/Assessment: Patient needs continued Speech Language  Pathology Services SLP Visit Diagnosis: Cognitive communication deficit (R41.841)     Assistance Recommended at Discharge  Frequent or constant Supervision/Assistance  Functional Status Assessment Patient has had a recent decline in their functional status and demonstrates the ability to make significant improvements in function in a reasonable and predictable amount of time.  Frequency and Duration min 2x/week  2 weeks      SLP Evaluation Cognition  Overall Cognitive Status: Impaired/Different from baseline Arousal/Alertness: Awake/alert Attention: Selective Selective Attention: Impaired Selective Attention Impairment: Verbal basic Memory: Impaired Memory Impairment: Retrieval deficit;Decreased short term memory Decreased Short Term Memory: Verbal basic;Functional basic Awareness: Impaired Awareness Impairment:  (online) Problem Solving: Impaired Problem Solving Impairment: Functional basic Safety/Judgment: Impaired       Comprehension  Auditory Comprehension Overall Auditory Comprehension: Appears within functional limits for tasks assessed Visual Recognition/Discrimination Discrimination: Within Function Limits Reading Comprehension Reading Status: Within funtional limits    Expression Expression Primary Mode of Expression: Verbal Verbal Expression Overall Verbal Expression: Appears within functional limits for tasks assessed   Oral / Motor  Oral Motor/Sensory Function Overall Oral Motor/Sensory Function: Moderate impairment Facial ROM: Reduced left;Suspected CN VII (facial) dysfunction Facial Symmetry: Abnormal symmetry left;Suspected CN VII (facial) dysfunction Facial Strength: Reduced left;Suspected CN VII (facial) dysfunction Facial Sensation: Reduced left;Suspected CN V (Trigeminal) dysfunction Lingual Symmetry: Suspected CN XII (hypoglossal) dysfunction Motor Speech Overall Motor Speech: Impaired Respiration: Within functional limits Phonation:  Normal Resonance: Within functional limits Articulation: Impaired Level of Impairment: Phrase Intelligibility: Intelligibility reduced Word: 75-100% accurate Phrase: 75-100% accurate Sentence: 75-100% accurate Motor Planning: Within functional limits           Aydin Cavalieri L. Jiana Lemaire,  MA CCC/SLP Clinical Specialist - Acute Care SLP Acute Rehabilitation Services Office number [phone]  [place]ice 05/07/2024, 2:33 PM

## 2024-05-07 NOTE — Progress Notes (Addendum)
   05/07/24 0958  Mobility  Activity Ambulated with assistance  Level of Assistance Moderate assist, patient does 50-74%  Assistive Device Front wheel walker  Distance Ambulated (ft) 10 ft  Activity Response Tolerated fair  Mobility Referral Yes  Mobility visit 1 Mobility  Mobility Specialist Start Time (ACUTE ONLY) [id number]  Mobility Specialist Stop Time (ACUTE ONLY) 1006  Mobility Specialist Time Calculation (min) (ACUTE ONLY) 8 min   Mobility Specialist: Progress Note  Pt agreeable to mobility session - received on BSC. C/o LLE weakness. Returned to bed in chair position with all needs met - call bell within reach. Bed alarm on.   Additional comments: Pt with knee buckling throughout, LOB x2, MS with hand on pt, required heavy verbal and tactile cues to initiate movement.  Student Nurse present during session.   Pt seen for additional session, STS x15, standing marches x10 each leg. C/o difficulty chewing - RN and MD notified via secure.  Douglas Stewart, BS Mobility Specialist Please contact via SecureChat or  Rehab office at [phone].

## 2024-05-07 NOTE — Progress Notes (Signed)
 PROGRESS NOTE        PATIENT DETAILS Name: Douglas Stewart Age: 63 y.o. Sex: male Date of Birth: 04-06-1961 Admit Date: 05/05/2024 Admitting Physician Evalene GORMAN Sprinkles, MD PCP:Pcp, No  Brief Summary: Patient is a 63 y.o.  male with history of HTN, HLD, DM-2-who presented with left-sided weakness-upon further workup-was found to have acute CVA.  Significant events: 11/11>> admit to [facility]  Significant studies: 11/10>> CT C-spine: No fracture-moderate disc degenerative changes C5-C7 11/11>> MRI brain: 2 cm acute infarct right basal ganglia/corona radiata 11/11>> CTA head/neck: No LVO/no significant stenosis 11/11>> MRI C-spine/T-spine/L-spine: No acute endings, multilevel cervical spondylosis-most advanced C5-C6.  Multiple level lumbar spondylosis.  At/L5-severe multifactorial spinal stenosis. 11/11>> echo: EF 40-45% 11/11>> LDL: 111 11/11>> A1c: 6.8  Significant microbiology data: None  Procedures: None  Consults: Neurology  Subjective: Lying comfortably in bed-denies any chest pain or shortness of breath.  Feels slightly better-continues to have mild left-sided weakness.  Objective: Vitals: Blood pressure 104/76, pulse 81, temperature 98.5 F (36.9 C), temperature source Oral, resp. rate 16, height 5' 6 (1.676 m), weight 73.9 kg, SpO2 98%.   Exam: Gen Exam:Alert awake-not in any distress HEENT:atraumatic, normocephalic Chest: B/L clear to auscultation anteriorly CVS:S1S2 regular Abdomen:soft non tender, non distended Extremities:no edema Neurology: Left-sided hemiparesis-unchanged. Skin: no rash  Pertinent Labs/Radiology:    Latest Ref Rng & Units 05/07/2024    3:15 AM 05/05/2024    3:30 PM 01/13/2024    5:00 AM  CBC  WBC 4.0 - 10.5 K/uL 3.5  5.6  3.7   Hemoglobin 13.0 - 17.0 g/dL 87.0  86.2  86.4   Hematocrit 39.0 - 52.0 % 38.4  40.4  40.0   Platelets 150 - 400 K/uL 230  246  351     Lab Results  Component Value Date   NA 137  05/07/2024   K 3.9 05/07/2024   CL 102 05/07/2024   CO2 24 05/07/2024      Assessment/Plan: Acute ischemic stroke Secondary to small vessel disease Workup as above Continues to have left-sided hemiparesis Neurology recommending aspirin /Plavix  x 3 weeks then aspirin  alone Awaiting SNF placement.  HTN BP currently controlled without the use of any antihypertensives-lisinopril  remains on hold  HLD Continue Lipitor 80 mg on discharge  DM-2 SSI  Code status:   Code Status: Full Code   DVT Prophylaxis: enoxaparin (LOVENOX) injection 40 mg Start: 05/06/24 1000   Family Communication: None at bedside   Disposition Plan: Status is: Inpatient Remains inpatient appropriate because: Severity of illness   Planned Discharge Destination:Skilled nursing facility   Diet: Diet Order             Diet Heart Room service appropriate? Yes; Fluid consistency: Thin  Diet effective now                     Antimicrobial agents: Anti-infectives (From admission, onward)    None        MEDICATIONS: Scheduled Meds:  aspirin  EC  81 mg Oral Daily   atorvastatin   80 mg Oral Daily   clopidogrel   75 mg Oral Daily   enoxaparin (LOVENOX) injection  40 mg Subcutaneous Q24H   influenza vac split trivalent PF  0.5 mL Intramuscular Tomorrow-1000   insulin  aspart  0-5 Units Subcutaneous QHS   insulin  aspart  0-6 Units Subcutaneous TID WC  pneumococcal 20-valent conjugate vaccine  0.5 mL Intramuscular Tomorrow-1000   Continuous Infusions: PRN Meds:.acetaminophen  **OR** acetaminophen  (TYLENOL ) oral liquid 160 mg/5 mL **OR** acetaminophen , senna-docusate   I have personally reviewed following labs and imaging studies  LABORATORY DATA: CBC: Recent Labs  Lab 05/05/24 1530 05/07/24 0315  WBC 5.6 3.5*  NEUTROABS 3.4  --   HGB 13.7 12.9*  HCT 40.4 38.4*  MCV 88.4 89.7  PLT 246 230    Basic Metabolic Panel: Recent Labs  Lab 05/05/24 1530 05/07/24 0315  NA 138 137  K  3.9 3.9  CL 100 102  CO2 23 24  GLUCOSE 187* 185*  BUN 12 14  CREATININE 0.75 0.91  CALCIUM  9.5 9.0    GFR: Estimated Creatinine Clearance: 74 mL/min (by C-G formula based on SCr of 0.91 mg/dL).  Liver Function Tests: No results for input(s): AST, ALT, ALKPHOS, BILITOT, PROT, ALBUMIN in the last 168 hours. No results for input(s): LIPASE, AMYLASE in the last 168 hours. No results for input(s): AMMONIA in the last 168 hours.  Coagulation Profile: No results for input(s): INR, PROTIME in the last 168 hours.  Cardiac Enzymes: No results for input(s): CKTOTAL, CKMB, CKMBINDEX, TROPONINI in the last 168 hours.  BNP (last 3 results) No results for input(s): PROBNP in the last 8760 hours.  Lipid Profile: Recent Labs    05/06/24 0546  CHOL 174  HDL 45  LDLCALC 111*  TRIG 90  CHOLHDL 3.9    Thyroid  Function Tests: No results for input(s): TSH, T4TOTAL, FREET4, T3FREE, THYROIDAB in the last 72 hours.  Anemia Panel: No results for input(s): VITAMINB12, FOLATE, FERRITIN, TIBC, IRON, RETICCTPCT in the last 72 hours.  Urine analysis:    Component Value Date/Time   COLORURINE YELLOW 05/05/2024 1520   APPEARANCEUR CLEAR 05/05/2024 1520   LABSPEC 1.023 05/05/2024 1520   PHURINE 5.0 05/05/2024 1520   GLUCOSEU NEGATIVE 05/05/2024 1520   HGBUR NEGATIVE 05/05/2024 1520   BILIRUBINUR NEGATIVE 05/05/2024 1520   KETONESUR NEGATIVE 05/05/2024 1520   PROTEINUR 30 (A) 05/05/2024 1520   NITRITE NEGATIVE 05/05/2024 1520   LEUKOCYTESUR NEGATIVE 05/05/2024 1520    Sepsis Labs: Lactic Acid, Venous No results found for: LATICACIDVEN  MICROBIOLOGY: No results found for this or any previous visit (from the past 240 hours).  RADIOLOGY STUDIES/RESULTS: ECHOCARDIOGRAM COMPLETE Result Date: 05/06/2024    ECHOCARDIOGRAM REPORT   Patient Name:   Douglas Stewart [facility] Date of Exam: 05/06/2024 Medical Rec #:  [id number]      Height:       66.0  in Accession #:    [id number]     Weight:       162.9 lb Date of Birth:  11-16-1960      BSA:          1.833 m Patient Age:    63 years       BP:           153/102 mmHg Patient Gender: M              HR:           75 bpm. Exam Location:  Inpatient Procedure: 2D Echo, Intracardiac Opacification Agent and Strain Analysis (Both            Spectral and Color Flow Doppler were utilized during procedure). Indications:    Stroke  History:        Patient has prior history of Echocardiogram examinations.  Sonographer:    Charmaine Gaskins Referring Phys: [phone]  JINDONG XU IMPRESSIONS  1. Left ventricular ejection fraction, by estimation, is 40 to 45%. The left ventricle has mildly decreased function. The left ventricle demonstrates global hypokinesis. Left ventricular diastolic parameters are consistent with Grade I diastolic dysfunction (impaired relaxation). The average left ventricular global longitudinal strain is -11.8 %. The global longitudinal strain is abnormal.  2. Right ventricular systolic function is normal. The right ventricular size is normal. There is normal pulmonary artery systolic pressure. The estimated right ventricular systolic pressure is 22.7 mmHg.  3. The mitral valve is myxomatous. No evidence of mitral valve regurgitation. No evidence of mitral stenosis.  4. The aortic valve is tricuspid. Aortic valve regurgitation is trivial.  5. Aortic dilatation noted. There is mild dilatation of the aortic root, measuring 42 mm. There is mild dilatation of the ascending aorta, measuring 39 mm.  6. The inferior vena cava is normal in size with greater than 50% respiratory variability, suggesting right atrial pressure of 3 mmHg. Comparison(s): No significant change from prior study. Prior images reviewed side by side. FINDINGS  Left Ventricle: No left ventricular thrombus is seen (Definity contrast was used). Left ventricular ejection fraction, by estimation, is 40 to 45%. The left ventricle has mildly decreased  function. The left ventricle demonstrates global hypokinesis. The  average left ventricular global longitudinal strain is -11.8 %. Strain was performed and the global longitudinal strain is abnormal. The left ventricular internal cavity size was normal in size. There is no left ventricular hypertrophy. Left ventricular  diastolic parameters are consistent with Grade I diastolic dysfunction (impaired relaxation). Normal left ventricular filling pressure. Right Ventricle: The right ventricular size is normal. No increase in right ventricular wall thickness. Right ventricular systolic function is normal. There is normal pulmonary artery systolic pressure. The tricuspid regurgitant velocity is 2.22 m/s, and  with an assumed right atrial pressure of 3 mmHg, the estimated right ventricular systolic pressure is 22.7 mmHg. Left Atrium: Left atrial size was normal in size. Right Atrium: Right atrial size was normal in size. Prominent Eustachian valve. Pericardium: There is no evidence of pericardial effusion. Mitral Valve: The mitral valve is myxomatous. There is mild thickening of the mitral valve leaflet(s). No evidence of mitral valve regurgitation. No evidence of mitral valve stenosis. Tricuspid Valve: The tricuspid valve is normal in structure. Tricuspid valve regurgitation is trivial. Aortic Valve: The aortic valve is tricuspid. Aortic valve regurgitation is trivial. Pulmonic Valve: The pulmonic valve was not well visualized. Pulmonic valve regurgitation is not visualized. No evidence of pulmonic stenosis. Aorta: Aortic dilatation noted. There is mild dilatation of the aortic root, measuring 42 mm. There is mild dilatation of the ascending aorta, measuring 39 mm. Venous: The inferior vena cava is normal in size with greater than 50% respiratory variability, suggesting right atrial pressure of 3 mmHg. IAS/Shunts: No atrial level shunt detected by color flow Doppler.  LEFT VENTRICLE PLAX 2D LVIDd:         3.90 cm       Diastology LVIDs:         2.60 cm      LV e' medial:    5.00 cm/s LV PW:         1.30 cm      LV E/e' medial:  11.0 LV IVS:        1.30 cm      LV e' lateral:   9.36 cm/s LVOT diam:     2.25 cm      LV E/e' lateral: 5.9 LVOT  Area:     3.98 cm                             2D Longitudinal Strain                             2D Strain GLS Avg:     -11.8 % LV Volumes (MOD) LV vol d, MOD A2C: 102.0 ml LV vol d, MOD A4C: 107.0 ml LV vol s, MOD A2C: 48.1 ml LV vol s, MOD A4C: 59.3 ml LV SV MOD A2C:     53.9 ml LV SV MOD A4C:     107.0 ml LV SV MOD BP:      54.2 ml RIGHT VENTRICLE RV Basal diam:  1.95 cm RV Mid diam:    2.07 cm RV S prime:     10.30 cm/s LEFT ATRIUM             Index        RIGHT ATRIUM           Index LA diam:        2.69 cm 1.47 cm/m   RA Area:     10.60 cm LA Vol (A2C):   44.6 ml 24.33 ml/m  RA Volume:   19.30 ml  10.53 ml/m LA Vol (A4C):   17.5 ml 9.55 ml/m LA Biplane Vol: 28.0 ml 15.28 ml/m   AORTA Ao Root diam: 4.22 cm Ao Asc diam:  3.91 cm MITRAL VALVE               TRICUSPID VALVE MV Area (PHT): 2.66 cm    TR Peak grad:   19.7 mmHg MV Decel Time: 285 msec    TR Vmax:        222.00 cm/s MV E velocity: 55.00 cm/s MV A velocity: 80.20 cm/s  SHUNTS MV E/A ratio:  0.69        Systemic Diam: 2.25 cm Jerel Croitoru MD Electronically signed by Jerel Balding MD Signature Date/Time: 05/06/2024/10:40:06 AM    Final    MR LUMBAR SPINE WO CONTRAST Result Date: 05/06/2024 CLINICAL DATA:  Myelopathy, acute, cervical spine Myelopathy, chronic, cervical spine; Myelopathy, acute, thoracic spine Myelopathy, chronic, thoracic spine; Low back pain, symptoms persist with > 6 wks treatment Multiple recent falls with back pain and left lower extremity weakness. History of stroke. EXAM: MRI CERVICAL, THORACIC AND LUMBAR SPINE WITHOUT CONTRAST TECHNIQUE: Multiplanar and multiecho pulse sequences of the cervical spine, to include the craniocervical junction and cervicothoracic junction, and thoracic and lumbar  spine, were obtained without intravenous contrast. COMPARISON:  CT cervical spine 05/05/2024. MRI lumbar spine 01/13/2024. FINDINGS: Technical note: Despite efforts by the technologist and patient, mild motion artifact is present on today's exam and could not be eliminated. This reduces exam sensitivity and specificity. MRI CERVICAL SPINE FINDINGS Alignment: The cervical alignment is stable with mild straightening and a slight retrolisthesis at C5-6. Vertebrae: No acute or suspicious osseous findings. Cord: Normal in signal and caliber. Posterior Fossa, vertebral arteries, paraspinal tissues: Visualized portions of the posterior fossa appear unremarkable.Bilateral vertebral artery flow voids. No significant paraspinal findings. Disc levels: C2-3: Preserved disc height. Mild bilateral facet hypertrophy contributes to mild foraminal narrowing bilaterally. No spinal stenosis. C3-4: Mild loss of disc height with mild disc bulging, uncinate spurring and bilateral facet hypertrophy. Mild spinal stenosis without cord deformity. Mild to moderate foraminal narrowing bilaterally. C4-5:  Mild loss of disc height with disc bulging, uncinate spurring and facet hypertrophy, greater on the left. Mild spinal stenosis without cord deformity. Mild right and moderate left foraminal narrowing. C5-6: Spondylosis is most advanced at this level with there is loss of disc height with posterior osteophytes covering diffusely bulging disc material. Resulting mild cord flattening with moderate to severe foraminal narrowing bilaterally. C6-7: Spondylosis with mild loss of disc height and posterior osteophytes covering diffusely bulging disc material. Mild bilateral facet hypertrophy. The CSF surrounding the cord is effaced without significant cord deformity. Mild to moderate right and moderate to severe left foraminal narrowing. C7-T1: Small left paracentral disc protrusion with mild bilateral facet hypertrophy. No cord deformity. Mild  foraminal narrowing, greater on the left. MRI THORACIC SPINE FINDINGS Alignment:  Physiologic. Vertebrae: No acute or suspicious osseous findings. Cord:  The thoracic cord appears normal in signal and caliber. Paraspinal and other soft tissues: No significant paraspinal abnormalities. Disc levels: The thoracic disc heights are generally preserved. There is no evidence of significant thoracic disc herniation, spinal stenosis or foraminal narrowing. Scattered paraspinal osteophytes are noted. MRI LUMBAR SPINE FINDINGS Segmentation: There are 5 lumbar type vertebral bodies. Alignment: Stable alignment with straightening and a minimal degenerative anterolisthesis at L4-5. Vertebrae: No worrisome osseous lesion, acute fracture or pars defect. Similar chronic endplate degenerative changes asymmetric to the left at L4-5 with marrow edema extending into the posterior elements. No evidence of discitis or osteomyelitis. The visualized sacroiliac joints appear unremarkable. Conus medullaris: Extends to the L1-2 level and appears normal. Paraspinal and other soft tissues: No significant paraspinal findings. Disc levels: L1-2: Preserved disc height. Prominent anterior osteophytes are again noted. No spinal stenosis or significant foraminal narrowing. L2-3: Preserved disc height with mild disc bulging, facet and ligamentous hypertrophy. Resulting mild multifactorial spinal stenosis with mild right-greater-than-left foraminal narrowing. No definite nerve root impingement. L3-4: Preserved disc height with annular disc bulging eccentric to the left. Moderate facet and ligamentous hypertrophy. Resulting moderate multifactorial spinal stenosis with mild lateral recess narrowing bilaterally. Mild right and moderate left foraminal narrowing appears unchanged from recent prior MRI. L4-5: Chronic loss of disc height with annular disc bulging, endplate osteophytes and endplate edema asymmetric to the left. Moderate to severe facet and  ligamentous hypertrophy contribute to severe multifactorial spinal stenosis, similar to recent prior MRI. There is associated moderate asymmetric narrowing of the left lateral recess and left foramen which appears chronic. L5-S1: Preserved disc height and hydration. Moderate to advanced bilateral facet hypertrophy. No significant central spinal stenosis. There is mild lateral recess and foraminal narrowing bilaterally. IMPRESSION: 1. No acute findings or clear explanation for the patient's symptoms in the cervical, thoracic or lumbar spine. 2. Multilevel cervical spondylosis, most advanced at C5-6 where there is mild cord flattening and moderate to severe foraminal narrowing bilaterally. No abnormal cord signal. 3. No significant thoracic spinal stenosis or foraminal narrowing. 4. Multilevel lumbar spondylosis, similar to previous MRI of 01/13/2024. 5. At L4-5 there is severe multifactorial spinal stenosis and moderate asymmetric narrowing of the left lateral recess and left foramen. 6. Moderate multifactorial spinal stenosis at L3-4 with mild right and moderate left foraminal narrowing. 7. Mild multifactorial spinal stenosis at L2-3. Electronically Signed   By: Elsie Perone M.D.   On: 05/06/2024 09:01   MR THORACIC SPINE WO CONTRAST Result Date: 05/06/2024 CLINICAL DATA:  Myelopathy, acute, cervical spine Myelopathy, chronic, cervical spine; Myelopathy, acute, thoracic spine Myelopathy, chronic, thoracic spine; Low back pain, symptoms persist with > 6 wks  treatment Multiple recent falls with back pain and left lower extremity weakness. History of stroke. EXAM: MRI CERVICAL, THORACIC AND LUMBAR SPINE WITHOUT CONTRAST TECHNIQUE: Multiplanar and multiecho pulse sequences of the cervical spine, to include the craniocervical junction and cervicothoracic junction, and thoracic and lumbar spine, were obtained without intravenous contrast. COMPARISON:  CT cervical spine 05/05/2024. MRI lumbar spine 01/13/2024.  FINDINGS: Technical note: Despite efforts by the technologist and patient, mild motion artifact is present on today's exam and could not be eliminated. This reduces exam sensitivity and specificity. MRI CERVICAL SPINE FINDINGS Alignment: The cervical alignment is stable with mild straightening and a slight retrolisthesis at C5-6. Vertebrae: No acute or suspicious osseous findings. Cord: Normal in signal and caliber. Posterior Fossa, vertebral arteries, paraspinal tissues: Visualized portions of the posterior fossa appear unremarkable.Bilateral vertebral artery flow voids. No significant paraspinal findings. Disc levels: C2-3: Preserved disc height. Mild bilateral facet hypertrophy contributes to mild foraminal narrowing bilaterally. No spinal stenosis. C3-4: Mild loss of disc height with mild disc bulging, uncinate spurring and bilateral facet hypertrophy. Mild spinal stenosis without cord deformity. Mild to moderate foraminal narrowing bilaterally. C4-5: Mild loss of disc height with disc bulging, uncinate spurring and facet hypertrophy, greater on the left. Mild spinal stenosis without cord deformity. Mild right and moderate left foraminal narrowing. C5-6: Spondylosis is most advanced at this level with there is loss of disc height with posterior osteophytes covering diffusely bulging disc material. Resulting mild cord flattening with moderate to severe foraminal narrowing bilaterally. C6-7: Spondylosis with mild loss of disc height and posterior osteophytes covering diffusely bulging disc material. Mild bilateral facet hypertrophy. The CSF surrounding the cord is effaced without significant cord deformity. Mild to moderate right and moderate to severe left foraminal narrowing. C7-T1: Small left paracentral disc protrusion with mild bilateral facet hypertrophy. No cord deformity. Mild foraminal narrowing, greater on the left. MRI THORACIC SPINE FINDINGS Alignment:  Physiologic. Vertebrae: No acute or suspicious  osseous findings. Cord:  The thoracic cord appears normal in signal and caliber. Paraspinal and other soft tissues: No significant paraspinal abnormalities. Disc levels: The thoracic disc heights are generally preserved. There is no evidence of significant thoracic disc herniation, spinal stenosis or foraminal narrowing. Scattered paraspinal osteophytes are noted. MRI LUMBAR SPINE FINDINGS Segmentation: There are 5 lumbar type vertebral bodies. Alignment: Stable alignment with straightening and a minimal degenerative anterolisthesis at L4-5. Vertebrae: No worrisome osseous lesion, acute fracture or pars defect. Similar chronic endplate degenerative changes asymmetric to the left at L4-5 with marrow edema extending into the posterior elements. No evidence of discitis or osteomyelitis. The visualized sacroiliac joints appear unremarkable. Conus medullaris: Extends to the L1-2 level and appears normal. Paraspinal and other soft tissues: No significant paraspinal findings. Disc levels: L1-2: Preserved disc height. Prominent anterior osteophytes are again noted. No spinal stenosis or significant foraminal narrowing. L2-3: Preserved disc height with mild disc bulging, facet and ligamentous hypertrophy. Resulting mild multifactorial spinal stenosis with mild right-greater-than-left foraminal narrowing. No definite nerve root impingement. L3-4: Preserved disc height with annular disc bulging eccentric to the left. Moderate facet and ligamentous hypertrophy. Resulting moderate multifactorial spinal stenosis with mild lateral recess narrowing bilaterally. Mild right and moderate left foraminal narrowing appears unchanged from recent prior MRI. L4-5: Chronic loss of disc height with annular disc bulging, endplate osteophytes and endplate edema asymmetric to the left. Moderate to severe facet and ligamentous hypertrophy contribute to severe multifactorial spinal stenosis, similar to recent prior MRI. There is associated  moderate asymmetric narrowing of  the left lateral recess and left foramen which appears chronic. L5-S1: Preserved disc height and hydration. Moderate to advanced bilateral facet hypertrophy. No significant central spinal stenosis. There is mild lateral recess and foraminal narrowing bilaterally. IMPRESSION: 1. No acute findings or clear explanation for the patient's symptoms in the cervical, thoracic or lumbar spine. 2. Multilevel cervical spondylosis, most advanced at C5-6 where there is mild cord flattening and moderate to severe foraminal narrowing bilaterally. No abnormal cord signal. 3. No significant thoracic spinal stenosis or foraminal narrowing. 4. Multilevel lumbar spondylosis, similar to previous MRI of 01/13/2024. 5. At L4-5 there is severe multifactorial spinal stenosis and moderate asymmetric narrowing of the left lateral recess and left foramen. 6. Moderate multifactorial spinal stenosis at L3-4 with mild right and moderate left foraminal narrowing. 7. Mild multifactorial spinal stenosis at L2-3. Electronically Signed   By: Elsie Perone M.D.   On: 05/06/2024 09:01   MR CERVICAL SPINE WO CONTRAST Result Date: 05/06/2024 CLINICAL DATA:  Myelopathy, acute, cervical spine Myelopathy, chronic, cervical spine; Myelopathy, acute, thoracic spine Myelopathy, chronic, thoracic spine; Low back pain, symptoms persist with > 6 wks treatment Multiple recent falls with back pain and left lower extremity weakness. History of stroke. EXAM: MRI CERVICAL, THORACIC AND LUMBAR SPINE WITHOUT CONTRAST TECHNIQUE: Multiplanar and multiecho pulse sequences of the cervical spine, to include the craniocervical junction and cervicothoracic junction, and thoracic and lumbar spine, were obtained without intravenous contrast. COMPARISON:  CT cervical spine 05/05/2024. MRI lumbar spine 01/13/2024. FINDINGS: Technical note: Despite efforts by the technologist and patient, mild motion artifact is present on today's exam and  could not be eliminated. This reduces exam sensitivity and specificity. MRI CERVICAL SPINE FINDINGS Alignment: The cervical alignment is stable with mild straightening and a slight retrolisthesis at C5-6. Vertebrae: No acute or suspicious osseous findings. Cord: Normal in signal and caliber. Posterior Fossa, vertebral arteries, paraspinal tissues: Visualized portions of the posterior fossa appear unremarkable.Bilateral vertebral artery flow voids. No significant paraspinal findings. Disc levels: C2-3: Preserved disc height. Mild bilateral facet hypertrophy contributes to mild foraminal narrowing bilaterally. No spinal stenosis. C3-4: Mild loss of disc height with mild disc bulging, uncinate spurring and bilateral facet hypertrophy. Mild spinal stenosis without cord deformity. Mild to moderate foraminal narrowing bilaterally. C4-5: Mild loss of disc height with disc bulging, uncinate spurring and facet hypertrophy, greater on the left. Mild spinal stenosis without cord deformity. Mild right and moderate left foraminal narrowing. C5-6: Spondylosis is most advanced at this level with there is loss of disc height with posterior osteophytes covering diffusely bulging disc material. Resulting mild cord flattening with moderate to severe foraminal narrowing bilaterally. C6-7: Spondylosis with mild loss of disc height and posterior osteophytes covering diffusely bulging disc material. Mild bilateral facet hypertrophy. The CSF surrounding the cord is effaced without significant cord deformity. Mild to moderate right and moderate to severe left foraminal narrowing. C7-T1: Small left paracentral disc protrusion with mild bilateral facet hypertrophy. No cord deformity. Mild foraminal narrowing, greater on the left. MRI THORACIC SPINE FINDINGS Alignment:  Physiologic. Vertebrae: No acute or suspicious osseous findings. Cord:  The thoracic cord appears normal in signal and caliber. Paraspinal and other soft tissues: No  significant paraspinal abnormalities. Disc levels: The thoracic disc heights are generally preserved. There is no evidence of significant thoracic disc herniation, spinal stenosis or foraminal narrowing. Scattered paraspinal osteophytes are noted. MRI LUMBAR SPINE FINDINGS Segmentation: There are 5 lumbar type vertebral bodies. Alignment: Stable alignment with straightening and a minimal degenerative anterolisthesis  at L4-5. Vertebrae: No worrisome osseous lesion, acute fracture or pars defect. Similar chronic endplate degenerative changes asymmetric to the left at L4-5 with marrow edema extending into the posterior elements. No evidence of discitis or osteomyelitis. The visualized sacroiliac joints appear unremarkable. Conus medullaris: Extends to the L1-2 level and appears normal. Paraspinal and other soft tissues: No significant paraspinal findings. Disc levels: L1-2: Preserved disc height. Prominent anterior osteophytes are again noted. No spinal stenosis or significant foraminal narrowing. L2-3: Preserved disc height with mild disc bulging, facet and ligamentous hypertrophy. Resulting mild multifactorial spinal stenosis with mild right-greater-than-left foraminal narrowing. No definite nerve root impingement. L3-4: Preserved disc height with annular disc bulging eccentric to the left. Moderate facet and ligamentous hypertrophy. Resulting moderate multifactorial spinal stenosis with mild lateral recess narrowing bilaterally. Mild right and moderate left foraminal narrowing appears unchanged from recent prior MRI. L4-5: Chronic loss of disc height with annular disc bulging, endplate osteophytes and endplate edema asymmetric to the left. Moderate to severe facet and ligamentous hypertrophy contribute to severe multifactorial spinal stenosis, similar to recent prior MRI. There is associated moderate asymmetric narrowing of the left lateral recess and left foramen which appears chronic. L5-S1: Preserved disc height  and hydration. Moderate to advanced bilateral facet hypertrophy. No significant central spinal stenosis. There is mild lateral recess and foraminal narrowing bilaterally. IMPRESSION: 1. No acute findings or clear explanation for the patient's symptoms in the cervical, thoracic or lumbar spine. 2. Multilevel cervical spondylosis, most advanced at C5-6 where there is mild cord flattening and moderate to severe foraminal narrowing bilaterally. No abnormal cord signal. 3. No significant thoracic spinal stenosis or foraminal narrowing. 4. Multilevel lumbar spondylosis, similar to previous MRI of 01/13/2024. 5. At L4-5 there is severe multifactorial spinal stenosis and moderate asymmetric narrowing of the left lateral recess and left foramen. 6. Moderate multifactorial spinal stenosis at L3-4 with mild right and moderate left foraminal narrowing. 7. Mild multifactorial spinal stenosis at L2-3. Electronically Signed   By: Elsie Perone M.D.   On: 05/06/2024 09:01   CT ANGIO HEAD NECK W WO CM Result Date: 05/06/2024 CLINICAL DATA:  Initial evaluation for acute neuro deficit, stroke. EXAM: CT ANGIOGRAPHY HEAD AND NECK WITH AND WITHOUT CONTRAST TECHNIQUE: Multidetector CT imaging of the head and neck was performed using the standard protocol during bolus administration of intravenous contrast. Multiplanar CT image reconstructions and MIPs were obtained to evaluate the vascular anatomy. Carotid stenosis measurements (when applicable) are obtained utilizing NASCET criteria, using the distal internal carotid diameter as the denominator. RADIATION DOSE REDUCTION: This exam was performed according to the departmental dose-optimization program which includes automated exposure control, adjustment of the mA and/or kV according to patient size and/or use of iterative reconstruction technique. CONTRAST:  75mL OMNIPAQUE  IOHEXOL  350 MG/ML SOLN COMPARISON:  Comparison made with MRI from earlier the same day as well as prior exam  from 01/12/2024. FINDINGS: CTA NECK FINDINGS Aortic arch: Aortic arch within normal limits for caliber with standard branch pattern. Mild aortic atherosclerosis. No stenosis about the origin of the great vessels. Right carotid system: Right common and internal carotid arteries are patent without dissection. Mild atheromatous irregularity about the l right carotid bulb without hemodynamically significant stenosis. Left carotid system: Left common and internal carotid arteries are patent without dissection. Eccentric mixed plaque about the left carotid bulb without hemodynamically significant greater than 50% stenosis. Vertebral arteries: Left vertebral artery slightly dominant. Vertebral arteries patent without stenosis or dissection. Skeleton: No worrisome osseous lesions. Moderate spondylosis  present at C5-6 and C6-7. Other neck: No other acute finding. Upper chest: No other acute finding. Review of the MIP images confirms the above findings CTA HEAD FINDINGS Anterior circulation: Both internal carotid arteries widely patent to the termini without stenosis. A1 segments widely patent. Normal anterior communicating artery complex. Both anterior cerebral arteries widely patent to their distal aspects without stenosis. No M1 stenosis or occlusion. Normal MCA bifurcations. Distal MCA branches well perfused and symmetric. Posterior circulation: Both V4 segments patent without stenosis. Left PICA patent. Right PICA not well seen. Basilar patent without stenosis. Superior cerebral arteries patent bilaterally. Right PCA supplied via the basilar. Predominant fetal type origin left PCA. Both PCAs patent without stenosis. Venous sinuses: Patent allowing for timing the contrast bolus. Anatomic variants: As above.  No aneurysm. Review of the MIP images confirms the above findings IMPRESSION: 1. Negative CTA for large vessel occlusion or other emergent finding. 2. Mild atheromatous change about the carotid bifurcations without  hemodynamically significant stenosis. Aortic Atherosclerosis (ICD10-I70.0). Electronically Signed   By: Morene Hoard M.D.   On: 05/06/2024 04:35   MR BRAIN WO CONTRAST Result Date: 05/06/2024 CLINICAL DATA:  Initial evaluation for multiple falls, history of stroke. EXAM: MRI HEAD WITHOUT CONTRAST TECHNIQUE: Multiplanar, multiecho pulse sequences of the brain and surrounding structures were obtained without intravenous contrast. COMPARISON:  ET from 05/05/2024. FINDINGS: Brain: Age-related cerebral atrophy. Patchy T2/FLAIR hyperintensity involving the supratentorial cerebral white matter, consistent with chronic small vessel ischemic disease. 2 cm acute ischemic nonhemorrhagic infarcts seen involving the right basal ganglia of/corona radiata (series 5, image 83). No significant mass effect. Gray-white matter differentiation otherwise maintained. No acute or significant chronic intracranial blood products. No mass lesion, midline shift or mass effect. Ventricular prominence related global parenchymal volume loss without hydrocephalus. No extra-axial fluid collection. Pituitary gland within normal limits. Vascular: Major intracranial vascular flow voids are maintained. Skull and upper cervical spine: Craniocervical junction within normal limits. Bone marrow signal intensity normal. No scalp soft tissue abnormality. Sinuses/Orbits: Prior bilateral ocular lens replacement. Scattered mucosal thickening noted about the ethmoidal air cells and maxillary sinuses. Few scattered superimposed left maxillary sinus retention cyst noted. No significant mastoid effusion. Other: None. IMPRESSION: 1. 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 2. Underlying age-related cerebral atrophy with chronic small vessel ischemic disease. Electronically Signed   By: Morene Hoard M.D.   On: 05/06/2024 01:08   DG Lumbar Spine Complete Result Date: 05/05/2024 EXAM: 4 OR MORE VIEW(S) XRAY  OF THE LUMBAR SPINE 05/05/2024 03:57:00 PM COMPARISON: Lumbar spine x-ray from 11/12/2017. CLINICAL HISTORY: falls FINDINGS: LUMBAR SPINE: BONES: No acute fracture. No aggressive appearing osseous lesion. Alignment is normal. Prostate radiotherapy seeds are present. DISCS AND DEGENERATIVE CHANGES: Moderate degenerative changes at L4-L5 with mild disc space narrowing, vacuum disc phenomenon and endplate sclerosis which has increased compared to 2019. SOFT TISSUES: No acute abnormality. IMPRESSION: 1. No acute abnormality of the lumbar spine related to the falls. 2. Moderate degenerative changes at L4-L5 have increased compared to 2019. Electronically signed by: Greig Pique MD 05/05/2024 04:26 PM EST RP Workstation: [id number]   CT Head Wo Contrast Result Date: 05/05/2024 CLINICAL DATA:  Head and neck trauma EXAM: CT HEAD WITHOUT CONTRAST CT CERVICAL SPINE WITHOUT CONTRAST TECHNIQUE: Multidetector CT imaging of the head and cervical spine was performed following the standard protocol without intravenous contrast. Multiplanar CT image reconstructions of the cervical spine were also generated. RADIATION DOSE REDUCTION: This exam was performed according to the departmental  dose-optimization program which includes automated exposure control, adjustment of the mA and/or kV according to patient size and/or use of iterative reconstruction technique. COMPARISON:  None Available. FINDINGS: CT HEAD FINDINGS Brain: No evidence of acute infarction, hemorrhage, hydrocephalus, extra-axial collection or mass lesion/mass effect. Periventricular and deep white matter hypodensity. Vascular: No hyperdense vessel or unexpected calcification. Skull: Normal. Negative for fracture or focal lesion. Sinuses/Orbits: No acute finding. Other: None. CT CERVICAL SPINE FINDINGS Alignment: Degenerative straightening of the normal cervical lordosis. Skull base and vertebrae: No acute fracture. No primary bone lesion or focal pathologic process.  Soft tissues and spinal canal: No prevertebral fluid or swelling. No visible canal hematoma. Disc levels: Moderate disc degenerative change of C5 through C7 with otherwise intact disc spaces. Upper chest: Negative. Other: None. IMPRESSION: 1. No acute intracranial pathology. Small-vessel white matter disease. 2. No fracture or static subluxation of the cervical spine. 3. Moderate disc degenerative change of C5 through C7 with otherwise intact disc spaces. Electronically Signed   By: Marolyn JONETTA Jaksch M.D.   On: 05/05/2024 16:03   CT Cervical Spine Wo Contrast Result Date: 05/05/2024 CLINICAL DATA:  Head and neck trauma EXAM: CT HEAD WITHOUT CONTRAST CT CERVICAL SPINE WITHOUT CONTRAST TECHNIQUE: Multidetector CT imaging of the head and cervical spine was performed following the standard protocol without intravenous contrast. Multiplanar CT image reconstructions of the cervical spine were also generated. RADIATION DOSE REDUCTION: This exam was performed according to the departmental dose-optimization program which includes automated exposure control, adjustment of the mA and/or kV according to patient size and/or use of iterative reconstruction technique. COMPARISON:  None Available. FINDINGS: CT HEAD FINDINGS Brain: No evidence of acute infarction, hemorrhage, hydrocephalus, extra-axial collection or mass lesion/mass effect. Periventricular and deep white matter hypodensity. Vascular: No hyperdense vessel or unexpected calcification. Skull: Normal. Negative for fracture or focal lesion. Sinuses/Orbits: No acute finding. Other: None. CT CERVICAL SPINE FINDINGS Alignment: Degenerative straightening of the normal cervical lordosis. Skull base and vertebrae: No acute fracture. No primary bone lesion or focal pathologic process. Soft tissues and spinal canal: No prevertebral fluid or swelling. No visible canal hematoma. Disc levels: Moderate disc degenerative change of C5 through C7 with otherwise intact disc spaces.  Upper chest: Negative. Other: None. IMPRESSION: 1. No acute intracranial pathology. Small-vessel white matter disease. 2. No fracture or static subluxation of the cervical spine. 3. Moderate disc degenerative change of C5 through C7 with otherwise intact disc spaces. Electronically Signed   By: Marolyn JONETTA Jaksch M.D.   On: 05/05/2024 16:03     LOS: 1 day   Donalda Applebaum, MD  [facility]    To contact the attending provider between 7A-7P or the covering provider during after hours 7P-7A, please log into the web site [email or web address] and access using universal [place] password for that web site. If you do not have the password, please call the hospital operator.  05/07/2024, 9:37 AM

## 2024-05-07 NOTE — NC FL2 (Addendum)
 [place]  MEDICAID FL2 LEVEL OF CARE FORM     IDENTIFICATION  Patient Name: Douglas Stewart Birthdate: 1960/09/30 Sex: male Admission Date (Current Location): 05/05/2024  [facility] and [place] Number:  Producer, Television/film/video and Address:  [facility] [place], [address] [place], [place] [address]      Provider Number: [id number]  Attending Physician Name and Address:  Raenelle Donalda HERO, MD  Relative Name and Phone Number:  Jandiel Magallanes (friend)  [phone]    Current Level of Care: Hospital Recommended Level of Care: Skilled Nursing Facility Prior Approval Number:    Date Approved/Denied:   PASRR Number: [id number]  Discharge Plan: SNF    Current Diagnoses: Patient Active Problem List   Diagnosis Date Noted   Acute ischemic stroke (HCC) 05/06/2024   History of stroke 05/06/2024   B12 deficiency 01/13/2024   Lumbar radiculopathy 01/13/2024   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   Housing instability 01/13/2024   Stroke (HCC) 01/12/2024   DM2 (diabetes mellitus, type 2) (HCC) 01/12/2024   Essential hypertension 01/12/2024   Ataxia 05/03/2015   Hearing loss in left ear 05/03/2015   Sensation of fullness in left ear 05/03/2015    Orientation RESPIRATION BLADDER Height & Weight     Self, Time, Situation, Place  Normal Indwelling catheter Weight: 162 lb 14.7 oz (73.9 kg) (Wt from 04/03/2024) Height:  5' 6 (167.6 cm)  BEHAVIORAL SYMPTOMS/MOOD NEUROLOGICAL BOWEL NUTRITION STATUS      Continent Diet (DIET DYS 3, thin liquids)  AMBULATORY STATUS COMMUNICATION OF NEEDS Skin   Limited Assist Verbally Normal                       Personal Care Assistance Level of Assistance   Bathing Feeding Dressing  Minimum  Minimum  Minimum      Functional Limitations Info  Speech     Speech Info: Impaired (Impaired s/p CVA)    SPECIAL CARE FACTORS FREQUENCY  PT (By licensed PT), OT (By licensed OT), Speech therapy      PT Frequency: 5x/week OT Frequency: 5x/week     Speech Therapy Frequency: 5x/week      Contractures Contractures Info: Not present    Additional Factors Info                  Current Medications (05/07/2024):  This is the current hospital active medication list Current Facility-Administered Medications  Medication Dose Route Frequency Provider Last Rate Last Admin   acetaminophen  (TYLENOL ) tablet 650 mg  650 mg Oral Q4H PRN Opyd, Timothy S, MD       Or   acetaminophen  (TYLENOL ) 160 MG/5ML solution 650 mg  650 mg Per Tube Q4H PRN Opyd, Timothy S, MD       Or   acetaminophen  (TYLENOL ) suppository 650 mg  650 mg Rectal Q4H PRN Opyd, Timothy S, MD       aspirin  EC tablet 81 mg  81 mg Oral Daily Opyd, Timothy S, MD   81 mg at 05/07/24 9071   atorvastatin  (LIPITOR) tablet 80 mg  80 mg Oral Daily Jerri Pfeiffer, MD   80 mg at 05/07/24 0930   clopidogrel  (PLAVIX ) tablet 75 mg  75 mg Oral Daily Jerri Pfeiffer, MD   75 mg at 05/07/24 0928   enoxaparin (LOVENOX) injection 40 mg  40 mg Subcutaneous Q24H Opyd, Timothy S, MD   40 mg at 05/07/24 0938   influenza vac split trivalent  PF (FLUZONE) injection 0.5 mL  0.5 mL Intramuscular Tomorrow-1000 Sreeram, Narendranath, MD       insulin  aspart (novoLOG ) injection 0-5 Units  0-5 Units Subcutaneous QHS Opyd, Timothy S, MD   3 Units at 05/06/24 2145   insulin  aspart (novoLOG ) injection 0-6 Units  0-6 Units Subcutaneous TID WC Opyd, Timothy S, MD   2 Units at 05/07/24 1336   pneumococcal 20-valent conjugate vaccine (PREVNAR 20) injection 0.5 mL  0.5 mL Intramuscular Tomorrow-1000 Sreeram, Narendranath, MD       senna-docusate (Senokot-S) tablet 1 tablet  1 tablet Oral QHS PRN Opyd, Timothy S, MD         Discharge Medications: Please see discharge summary for a list of discharge medications.  Relevant Imaging Results:  Relevant Lab Results:   Additional Information  SSN [id number]  Ryan  [place], [place]

## 2024-05-07 NOTE — Plan of Care (Signed)

## 2024-05-07 NOTE — TOC Initial Note (Addendum)
 Transition of Care [facility]) - Initial/Assessment Note    Patient Details  Name: Douglas Stewart MRN: [id number] Date of Birth: [birth year]  Transition of Care [facility]) CM/SW Contact:    Bernardino Dean, LCSWA Phone Number: 05/07/2024, 3:28 PM  Clinical Narrative:                 CSW received consult for SNF placement. Met with patient at bedside. Discussed disposition form the hospital. He verbalizes strong motivation for rehab and is agreeable to pursue SNF per PT/OT recs. PASRR and FL2 completed, sent referrals out and will review Medicare star ratings list when beds are offered.   The patient also expressed interest in housing resources, as he is staying with his friend currently and would like to eventually get a private residence to himself. He states he will be able to return to his friends house, but would like to take advantage of having a child psychotherapist in front of him. Reviewed and provided housing resources, including coordinated entry for section-8 and subsidized senior housing.   TOC following for SNF placement.   4:32 - Presented patient offers for available beds. Patient selected [facility]. Upon follow up, [facility] unable to accept d/t COVID outbreak. Requested [facility] and [facility] review. TOC Following for SNF placement.   Expected Discharge Plan: Skilled Nursing Facility Barriers to Discharge: Insurance Authorization, No SNF bed   Patient Goals and CMS Choice Patient states their goals for this hospitalization and ongoing recovery are:: Achieve SNF placement to rehab from CVA CMS Medicare.gov Compare Post Acute Care list provided to:: Patient Choice offered to / list presented to : Patient [place] ownership interest in [facility].provided to:: Patient    Expected Discharge Plan and Services     Post Acute Care Choice: Skilled Nursing Facility Living arrangements for the past 2 months: Apartment                                      Prior Living  Arrangements/Services Living arrangements for the past 2 months: Apartment Lives with:: Friends Patient language and need for interpreter reviewed:: Yes Do you feel safe going back to the place where you live?: Yes      Need for Family Participation in Patient Care: No (Comment) Care giver support system in place?: Yes (comment)      Activities of Daily Living   ADL Screening (condition at time of admission) Independently performs ADLs?: No Does the patient have a NEW difficulty with bathing/dressing/toileting/self-feeding that is expected to last >3 days?: Yes (Initiates electronic notice to provider for possible OT consult) Does the patient have a NEW difficulty with getting in/out of bed, walking, or climbing stairs that is expected to last >3 days?: Yes (Initiates electronic notice to provider for possible PT consult) Does the patient have a NEW difficulty with communication that is expected to last >3 days?: Yes (Initiates electronic notice to provider for possible SLP consult) Is the patient deaf or have difficulty hearing?: No Does the patient have difficulty seeing, even when wearing glasses/contacts?: No Does the patient have difficulty concentrating, remembering, or making decisions?: No  Permission Sought/Granted                  Emotional Assessment Appearance:: Appears stated age   Affect (typically observed): Appropriate Orientation: : Oriented to Self, Oriented to Place, Oriented to  Time, Oriented to Situation Alcohol / Substance  Use: Never Used Psych Involvement: No (comment)  Admission diagnosis:  Acute ischemic stroke [facility]) [I63.9] Cerebrovascular accident (CVA), unspecified mechanism (HCC) [I63.9] Patient Active Problem List   Diagnosis Date Noted   Acute ischemic stroke (HCC) 05/06/2024   History of stroke 05/06/2024   B12 deficiency 01/13/2024   Lumbar radiculopathy 01/13/2024   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   Housing  instability 01/13/2024   Stroke (HCC) 01/12/2024   DM2 (diabetes mellitus, type 2) (HCC) 01/12/2024   Essential hypertension 01/12/2024   Ataxia 05/03/2015   Hearing loss in left ear 05/03/2015   Sensation of fullness in left ear 05/03/2015   PCP:  Pcp, No Pharmacy:   [facility] [address], [address] [place] [address] Phone: [phone] Fax: [phone]     Social Drivers of Health (SDOH) Social History: SDOH Screenings   Food Insecurity: No Food Insecurity (05/06/2024)  Housing: Low Risk  (05/06/2024)  Transportation Needs: No Transportation Needs (05/06/2024)  Utilities: Not At Risk (05/06/2024)  Tobacco Use: Medium Risk (05/06/2024)   SDOH Interventions:     Readmission Risk Interventions     No data to display

## 2024-05-07 NOTE — Evaluation (Signed)
 Clinical/Bedside Swallow Evaluation Patient Details  Name: Douglas Stewart MRN: [id number] Date of Birth: [birth year]  Today's Date: 05/07/2024 Time: SLP Start Time (ACUTE ONLY): 1403 SLP Stop Time (ACUTE ONLY): 1422 SLP Time Calculation (min) (ACUTE ONLY): 19 min  Past Medical History:  Past Medical History:  Diagnosis Date   History of stroke 05/06/2024   Hypertension    Prostate cancer [facility])    Past Surgical History:  Past Surgical History:  Procedure Laterality Date   no surgical history     HPI:  Douglas Stewart is a 63 y.o. male who presented to ED on 05/06/24 with left sided weakness. MRI: 2 cm acute infarct right basal ganglia/corona radiata. PMHx prostate ca, HTN, DM2, prior CVA 12/2023 left posterior frontal lobe with no language deficits, mildly impaired exec functioning and organization of thoughts.    Assessment / Plan / Recommendation  Clinical Impression  Pt presents with a primary oral dysphagia marked by sensory deficits on left leading to oral residue post-swallow and spillage from left side of mouth without awareness.  Pt volunteered that he was having difficulty chewing after this stroke, so swallow eval consult was requested.  There were no s/s of a pharyngeal dysphagia - he appears to be protecting airway; no concerns for aspiration. Impulsive during self-feeding; difficulty finding items on left side of tray. Recommend continuing dysphagia 3 diet/thin liquids; meds whole in liquid. SLP will follow.   SLP Visit Diagnosis: Dysphagia, oral phase (R13.11)    Aspiration Risk  No limitations    Diet Recommendation   Thin;Dysphagia 3 (mechanical soft)  Medication Administration: Whole meds with liquid    Other  Recommendations Oral Care Recommendations: Oral care BID     Assistance Recommended at Discharge Frequent or constant Supervision/Assistance  Functional Status Assessment Patient has had a recent decline in their functional status and demonstrates the ability  to make significant improvements in function in a reasonable and predictable amount of time.  Frequency and Duration min 2x/week  2 weeks       Prognosis Prognosis for improved oropharyngeal function: Good Barriers to Reach Goals: Cognitive deficits      Swallow Study   General Date of Onset: 05/06/24 HPI: Douglas Stewart is a 63 y.o. male who presented to ED on 05/06/24 with left sided weakness. MRI: 2 cm acute infarct right basal ganglia/corona radiata. PMHx prostate ca, HTN, DM2, prior CVA 12/2023 left posterior frontal lobe with no language deficits, mildly impaired exec functioning and organization of thoughts. Type of Study: Bedside Swallow Evaluation Previous Swallow Assessment: no Diet Prior to this Study: Dysphagia 3 (mechanical soft);Thin liquids (Level 0) Temperature Spikes Noted: No Respiratory Status: Room air History of Recent Intubation: No Behavior/Cognition: Alert;Cooperative Oral Cavity Assessment: Within Functional Limits Oral Care Completed by SLP: No Oral Cavity - Dentition: Adequate natural dentition Vision: Functional for self-feeding Self-Feeding Abilities: Able to feed self Patient Positioning: Upright in bed Baseline Vocal Quality: Normal Volitional Cough: Strong Volitional Swallow: Able to elicit    Oral/Motor/Sensory Function Overall Oral Motor/Sensory Function: Moderate impairment Facial ROM: Reduced left;Suspected CN VII (facial) dysfunction Facial Symmetry: Abnormal symmetry left;Suspected CN VII (facial) dysfunction Facial Strength: Reduced left;Suspected CN VII (facial) dysfunction Facial Sensation: Reduced left;Suspected CN V (Trigeminal) dysfunction Lingual Symmetry: Suspected CN XII (hypoglossal) dysfunction   Ice Chips Ice chips: Within functional limits   Thin Liquid Thin Liquid: Impaired Presentation: Cup;Straw Oral Phase Functional Implications: Left anterior spillage    Nectar Thick Nectar Thick Liquid: Not tested   Honey  Thick Honey  Thick Liquid: Not tested   Puree Puree: Within functional limits   Solid     Solid: Impaired Oral Phase Functional Implications: Left anterior spillage;Oral residue      Douglas Stewart 05/07/2024,2:41 PM  Palma L. Vona, MA CCC/SLP Clinical Specialist - Acute Care SLP Acute Rehabilitation Services Office number [phone]

## 2024-05-08 ENCOUNTER — Other Ambulatory Visit (HOSPITAL_COMMUNITY): Payer: Self-pay

## 2024-05-08 DIAGNOSIS — E119 Type 2 diabetes mellitus without complications: Secondary | ICD-10-CM | POA: Diagnosis not present

## 2024-05-08 DIAGNOSIS — I10 Essential (primary) hypertension: Secondary | ICD-10-CM | POA: Diagnosis not present

## 2024-05-08 DIAGNOSIS — I639 Cerebral infarction, unspecified: Secondary | ICD-10-CM | POA: Diagnosis not present

## 2024-05-08 LAB — GLUCOSE, CAPILLARY
Glucose-Capillary: 188 mg/dL — ABNORMAL HIGH (ref 70–99)
Glucose-Capillary: 312 mg/dL — ABNORMAL HIGH (ref 70–99)

## 2024-05-08 MED ORDER — ASPIRIN 81 MG PO TBEC: 81.0000 mg | DELAYED_RELEASE_TABLET | Freq: Every day | ORAL | Status: AC

## 2024-05-08 MED ORDER — ATORVASTATIN CALCIUM 80 MG PO TABS
80.0000 mg | ORAL_TABLET | Freq: Every day | ORAL | 0 refills | Status: AC
Start: 2024-05-08 — End: 2024-08-06
  Filled 2024-05-08: qty 90, 90d supply, fill #0

## 2024-05-08 MED ORDER — CLOPIDOGREL BISULFATE 75 MG PO TABS: 75.0000 mg | ORAL_TABLET | Freq: Every day | ORAL | Status: AC

## 2024-05-08 NOTE — Plan of Care (Signed)

## 2024-05-08 NOTE — Plan of Care (Signed)
 Problem: Education: Goal: Ability to describe self-care measures that may prevent or decrease complications (Diabetes Survival Skills Education) will improve Outcome: Adequate for Discharge Goal: Individualized Educational Video(s) Outcome: Adequate for Discharge   Problem: Coping: Goal: Ability to adjust to condition or change in health will improve Outcome: Adequate for Discharge   Problem: Fluid Volume: Goal: Ability to maintain a balanced intake and output will improve Outcome: Adequate for Discharge   Problem: Health Behavior/Discharge Planning: Goal: Ability to identify and utilize available resources and services will improve Outcome: Adequate for Discharge Goal: Ability to manage health-related needs will improve Outcome: Adequate for Discharge   Problem: Metabolic: Goal: Ability to maintain appropriate glucose levels will improve Outcome: Adequate for Discharge   Problem: Nutritional: Goal: Maintenance of adequate nutrition will improve Outcome: Adequate for Discharge Goal: Progress toward achieving an optimal weight will improve Outcome: Adequate for Discharge   Problem: Skin Integrity: Goal: Risk for impaired skin integrity will decrease Outcome: Adequate for Discharge   Problem: Tissue Perfusion: Goal: Adequacy of tissue perfusion will improve Outcome: Adequate for Discharge   Problem: Education: Goal: Knowledge of disease or condition will improve Outcome: Adequate for Discharge Goal: Knowledge of secondary prevention will improve (MUST DOCUMENT ALL) Outcome: Adequate for Discharge Goal: Knowledge of patient specific risk factors will improve (DELETE if not current risk factor) Outcome: Adequate for Discharge   Problem: Ischemic Stroke/TIA Tissue Perfusion: Goal: Complications of ischemic stroke/TIA will be minimized Outcome: Adequate for Discharge   Problem: Coping: Goal: Will verbalize positive feelings about self Outcome: Adequate for  Discharge Goal: Will identify appropriate support needs Outcome: Adequate for Discharge   Problem: Health Behavior/Discharge Planning: Goal: Ability to manage health-related needs will improve Outcome: Adequate for Discharge Goal: Goals will be collaboratively established with patient/family Outcome: Adequate for Discharge   Problem: Self-Care: Goal: Ability to participate in self-care as condition permits will improve Outcome: Adequate for Discharge Goal: Verbalization of feelings and concerns over difficulty with self-care will improve Outcome: Adequate for Discharge Goal: Ability to communicate needs accurately will improve Outcome: Adequate for Discharge   Problem: Nutrition: Goal: Risk of aspiration will decrease Outcome: Adequate for Discharge Goal: Dietary intake will improve Outcome: Adequate for Discharge   Problem: Education: Goal: Knowledge of General Education information will improve Description: Including pain rating scale, medication(s)/side effects and non-pharmacologic comfort measures Outcome: Adequate for Discharge   Problem: Health Behavior/Discharge Planning: Goal: Ability to manage health-related needs will improve Outcome: Adequate for Discharge   Problem: Clinical Measurements: Goal: Ability to maintain clinical measurements within normal limits will improve Outcome: Adequate for Discharge Goal: Will remain free from infection Outcome: Adequate for Discharge Goal: Diagnostic test results will improve Outcome: Adequate for Discharge Goal: Respiratory complications will improve Outcome: Adequate for Discharge Goal: Cardiovascular complication will be avoided Outcome: Adequate for Discharge   Problem: Activity: Goal: Risk for activity intolerance will decrease Outcome: Adequate for Discharge   Problem: Nutrition: Goal: Adequate nutrition will be maintained Outcome: Adequate for Discharge   Problem: Coping: Goal: Level of anxiety will  decrease Outcome: Adequate for Discharge   Problem: Elimination: Goal: Will not experience complications related to bowel motility Outcome: Adequate for Discharge Goal: Will not experience complications related to urinary retention Outcome: Adequate for Discharge   Problem: Pain Managment: Goal: General experience of comfort will improve and/or be controlled Outcome: Adequate for Discharge   Problem: Safety: Goal: Ability to remain free from injury will improve Outcome: Adequate for Discharge   Problem: Skin Integrity: Goal: Risk for  impaired skin integrity will decrease Outcome: Adequate for Discharge

## 2024-05-08 NOTE — TOC Transition Note (Signed)
 Transition of Care [facility] - Discharge Note   Patient Details  Name: Douglas Stewart MRN: [id number] Date of Birth: 07/10/1960  Transition of [facility]) CM/SW Contact:  Bernardino Dean, LCSWA Phone Number: 05/08/2024, 12:34 PM   Clinical Narrative:    Patient will DC to: Anticipated DC date: Family notified: Transport by:   Per MD patient ready for DC to SNF at [facility]. RN to call report prior to discharge [phone], room 111). RN, patient, and facility notified of DC. Discharge Summary, CMN, and FL2 sent to facility. DC packet on chart. Ambulance transport requested for patient.   CSW will sign off for now as social work intervention is no longer needed. Please consult us  again if new needs arise.     Final next level of care: Skilled Nursing Facility Barriers to Discharge: No Barriers Identified   Patient Goals and CMS Choice Patient states their goals for this hospitalization and ongoing recovery are:: Rehab from [facility] CMS Medicare.gov Compare Post Acute Care list provided to:: Patient Choice offered to / list presented to : Patient [place] ownership interest in [facility].provided to:: Patient    Discharge Placement PASRR number recieved: 05/07/24            Patient chooses bed at: Other - please specify in the comment section below: [name]) Patient to be transferred to facility by: [facility] Name of family member notified: Patient Patient and family notified of of transfer: 05/08/24  Discharge Plan and Services Additional resources added to the After Visit Summary for       Post Acute Care Choice: Skilled Nursing Facility                               Social Drivers of Health (SDOH) Interventions SDOH Screenings   Food Insecurity: No Food Insecurity (05/06/2024)  Housing: Low Risk  (05/06/2024)  Transportation Needs: No Transportation Needs (05/06/2024)  Utilities: Not At Risk (05/06/2024)  Tobacco Use: Medium Risk (05/06/2024)      Readmission Risk Interventions     No data to display

## 2024-05-08 NOTE — TOC Progression Note (Signed)
 Transition of Care [facility]) - Progression Note    Patient Details  Name: Douglas Stewart MRN: [id number] Date of Birth: [birth year]  Transition of [facility]) CM/SW Contact  Bernardino Dean, [place] Phone Number: 05/08/2024, 12:23 PM  Clinical Narrative:    [facility], and [facility] unable to accept patient. Reviewed accepting facilities within patient's preferred distance (city of [place]), [facility] and [facility]. Patient selects [facility]. Auth was submitted via [employer] and approved: 11/13-11/17, #[id number]. Patient agreeable to ambulance transport. For discharge later today.    Expected Discharge Plan: Skilled Nursing Facility Barriers to Discharge: Insurance Authorization, No SNF bed               Expected Discharge Plan and Services     Post Acute Care Choice: Skilled Nursing Facility Living arrangements for the past 2 months: Apartment Expected Discharge Date: 05/08/24                                     Social Drivers of Health (SDOH) Interventions SDOH Screenings   Food Insecurity: No Food Insecurity (05/06/2024)  Housing: Low Risk  (05/06/2024)  Transportation Needs: No Transportation Needs (05/06/2024)  Utilities: Not At Risk (05/06/2024)  Tobacco Use: Medium Risk (05/06/2024)    Readmission Risk Interventions     No data to display

## 2024-05-08 NOTE — Discharge Summary (Signed)
 PATIENT DETAILS Name: Douglas Stewart Age: 63 y.o. Sex: male Date of Birth: 05-Apr-1961 MRN: [id number]. Admitting Physician: Evalene GORMAN Sprinkles, MD PCP:Pcp, No  Admit Date: 05/05/2024 Discharge date: 05/08/2024  Recommendations for Outpatient Follow-up:  Follow up with PCP in 1-2 weeks Please obtain CMP/CBC in one week Aspirin /Plavix  x 3 weeks-then aspirin  alone  Admitted From:  Home  Disposition: Skilled nursing facility   Discharge Condition: good  CODE STATUS:   Code Status: Full Code   Diet recommendation:  Diet Order             Diet - low sodium heart healthy           Diet Carb Modified           DIET DYS 3 Room service appropriate? Yes with Assist; Fluid consistency: Thin  Diet effective now                    Brief Summary: Patient is a 63 y.o.  male with history of HTN, HLD, DM-2-who presented with left-sided weakness-upon further workup-was found to have acute CVA.   Significant events: 11/11>> admit to [facility]   Significant studies: 11/10>> CT C-spine: No fracture-moderate disc degenerative changes C5-C7 11/11>> MRI brain: 2 cm acute infarct right basal ganglia/corona radiata 11/11>> CTA head/neck: No LVO/no significant stenosis 11/11>> MRI C-spine/T-spine/L-spine: No acute endings, multilevel cervical spondylosis-most advanced C5-C6.  Multiple level lumbar spondylosis.  At/L5-severe multifactorial spinal stenosis. 11/11>> echo: EF 40-45% 11/11>> LDL: 111 11/11>> A1c: 6.8   Significant microbiology data: None   Procedures: None   Consults: Neurology  Brief Hospital Course: Acute ischemic stroke Secondary to small vessel disease Workup as above Continues to have left-sided hemiparesis Neurology recommending aspirin /Plavix  x 3 weeks then aspirin  alone Awaiting SNF placement.   HTN BP currently controlled without the use of any antihypertensives-lisinopril  remains on hold-resume when able.   HLD Continue Lipitor 80 mg on discharge    DM-2 SSI while inpatient Will start metformin on discharge.   Discharge Diagnoses:  Principal Problem:   Acute ischemic stroke [facility]) Active Problems:   DM2 (diabetes mellitus, type 2) (HCC)   Essential hypertension   Discharge Instructions:  Activity:  As tolerated with Full fall precautions use walker/cane & assistance as needed  Discharge Instructions     Ambulatory referral to Neurology   Complete by: As directed    Follow up with Dr. Margaret at [facility] in 4-6 weeks.  Patient is Dr. Chancy patient. Thanks.   Diet - low sodium heart healthy   Complete by: As directed    Diet Carb Modified   Complete by: As directed    Discharge instructions   Complete by: As directed    Follow with Primary MD in 1-2 weeks  Please get a complete blood count and chemistry panel checked by your Primary MD at your next visit, and again as instructed by your Primary MD.  Get Medicines reviewed and adjusted: Please take all your medications with you for your next visit with your Primary MD  Laboratory/radiological data: Please request your Primary MD to go over all hospital tests and procedure/radiological results at the follow up, please ask your Primary MD to get all Hospital records sent to his/her office.  In some cases, they will be blood work, cultures and biopsy results pending at the time of your discharge. Please request that your primary care M.D. follows up on these results.  Also Note the following: If you experience worsening of  your admission symptoms, develop shortness of breath, life threatening emergency, suicidal or homicidal thoughts you must seek medical attention immediately by calling 911 or calling your MD immediately  if symptoms less severe.  You must read complete instructions/literature along with all the possible adverse reactions/side effects for all the Medicines you take and that have been prescribed to you. Take any new Medicines after you have completely  understood and accpet all the possible adverse reactions/side effects.   Do not drive when taking Pain medications or sleeping medications (Benzodaizepines)  Do not take more than prescribed Pain, Sleep and Anxiety Medications. It is not advisable to combine anxiety,sleep and pain medications without talking with your primary care practitioner  Special Instructions: If you have smoked or chewed Tobacco  in the last 2 yrs please stop smoking, stop any regular Alcohol  and or any Recreational drug use.  Wear Seat belts while driving.  Please note: You were cared for by a hospitalist during your hospital stay. Once you are discharged, your primary care physician will handle any further medical issues. Please note that NO REFILLS for any discharge medications will be authorized once you are discharged, as it is imperative that you return to your primary care physician (or establish a relationship with a primary care physician if you do not have one) for your post hospital discharge needs so that they can reassess your need for medications and monitor your lab values.   Increase activity slowly   Complete by: As directed       Allergies as of 05/08/2024   No Known Allergies      Medication List     TAKE these medications    aspirin  EC 81 MG tablet Take 1 tablet (81 mg total) by mouth daily. Swallow whole. Start taking on: May 09, 2024   atorvastatin  80 MG tablet Commonly known as: LIPITOR Take 1 tablet (80 mg total) by mouth daily. What changed:  medication strength how much to take   clopidogrel  75 MG tablet Commonly known as: PLAVIX  Take 1 tablet (75 mg total) by mouth daily for 21 days. Start taking on: May 09, 2024   lisinopril  10 MG tablet Commonly known as: ZESTRIL  Take 1 tablet (10 mg total) by mouth daily.        Follow-up Information     Penumalli, Eduard SAUNDERS, MD. Schedule an appointment as soon as possible for a visit in 1 month(s).   Specialties:  Neurology, Radiology Contact information: [address] [place] [address] [phone]         Primary care practitioner. Schedule an appointment as soon as possible for a visit in 1 week(s).                 No Known Allergies   Other Procedures/Studies: ECHOCARDIOGRAM COMPLETE Result Date: 05/06/2024    ECHOCARDIOGRAM REPORT   Patient Name:   DEO MEHRINGER [facility] Date of Exam: 05/06/2024 Medical Rec #:  [id number]      Height:       66.0 in Accession #:    [id number]     Weight:       162.9 lb Date of Birth:  Feb 12, 1961      BSA:          1.833 m Patient Age:    63 years       BP:           153/102 mmHg Patient Gender: M  HR:           75 bpm. Exam Location:  Inpatient Procedure: 2D Echo, Intracardiac Opacification Agent and Strain Analysis (Both            Spectral and Color Flow Doppler were utilized during procedure). Indications:    Stroke  History:        Patient has prior history of Echocardiogram examinations.  Sonographer:    Charmaine Gaskins Referring Phys: [id number] JINDONG XU IMPRESSIONS  1. Left ventricular ejection fraction, by estimation, is 40 to 45%. The left ventricle has mildly decreased function. The left ventricle demonstrates global hypokinesis. Left ventricular diastolic parameters are consistent with Grade I diastolic dysfunction (impaired relaxation). The average left ventricular global longitudinal strain is -11.8 %. The global longitudinal strain is abnormal.  2. Right ventricular systolic function is normal. The right ventricular size is normal. There is normal pulmonary artery systolic pressure. The estimated right ventricular systolic pressure is 22.7 mmHg.  3. The mitral valve is myxomatous. No evidence of mitral valve regurgitation. No evidence of mitral stenosis.  4. The aortic valve is tricuspid. Aortic valve regurgitation is trivial.  5. Aortic dilatation noted. There is mild dilatation of the aortic root, measuring 42 mm. There is mild  dilatation of the ascending aorta, measuring 39 mm.  6. The inferior vena cava is normal in size with greater than 50% respiratory variability, suggesting right atrial pressure of 3 mmHg. Comparison(s): No significant change from prior study. Prior images reviewed side by side. FINDINGS  Left Ventricle: No left ventricular thrombus is seen (Definity contrast was used). Left ventricular ejection fraction, by estimation, is 40 to 45%. The left ventricle has mildly decreased function. The left ventricle demonstrates global hypokinesis. The  average left ventricular global longitudinal strain is -11.8 %. Strain was performed and the global longitudinal strain is abnormal. The left ventricular internal cavity size was normal in size. There is no left ventricular hypertrophy. Left ventricular  diastolic parameters are consistent with Grade I diastolic dysfunction (impaired relaxation). Normal left ventricular filling pressure. Right Ventricle: The right ventricular size is normal. No increase in right ventricular wall thickness. Right ventricular systolic function is normal. There is normal pulmonary artery systolic pressure. The tricuspid regurgitant velocity is 2.22 m/s, and  with an assumed right atrial pressure of 3 mmHg, the estimated right ventricular systolic pressure is 22.7 mmHg. Left Atrium: Left atrial size was normal in size. Right Atrium: Right atrial size was normal in size. Prominent Eustachian valve. Pericardium: There is no evidence of pericardial effusion. Mitral Valve: The mitral valve is myxomatous. There is mild thickening of the mitral valve leaflet(s). No evidence of mitral valve regurgitation. No evidence of mitral valve stenosis. Tricuspid Valve: The tricuspid valve is normal in structure. Tricuspid valve regurgitation is trivial. Aortic Valve: The aortic valve is tricuspid. Aortic valve regurgitation is trivial. Pulmonic Valve: The pulmonic valve was not well visualized. Pulmonic valve  regurgitation is not visualized. No evidence of pulmonic stenosis. Aorta: Aortic dilatation noted. There is mild dilatation of the aortic root, measuring 42 mm. There is mild dilatation of the ascending aorta, measuring 39 mm. Venous: The inferior vena cava is normal in size with greater than 50% respiratory variability, suggesting right atrial pressure of 3 mmHg. IAS/Shunts: No atrial level shunt detected by color flow Doppler.  LEFT VENTRICLE PLAX 2D LVIDd:         3.90 cm      Diastology LVIDs:  2.60 cm      LV e' medial:    5.00 cm/s LV PW:         1.30 cm      LV E/e' medial:  11.0 LV IVS:        1.30 cm      LV e' lateral:   9.36 cm/s LVOT diam:     2.25 cm      LV E/e' lateral: 5.9 LVOT Area:     3.98 cm                             2D Longitudinal Strain                             2D Strain GLS Avg:     -11.8 % LV Volumes (MOD) LV vol d, MOD A2C: 102.0 ml LV vol d, MOD A4C: 107.0 ml LV vol s, MOD A2C: 48.1 ml LV vol s, MOD A4C: 59.3 ml LV SV MOD A2C:     53.9 ml LV SV MOD A4C:     107.0 ml LV SV MOD BP:      54.2 ml RIGHT VENTRICLE RV Basal diam:  1.95 cm RV Mid diam:    2.07 cm RV S prime:     10.30 cm/s LEFT ATRIUM             Index        RIGHT ATRIUM           Index LA diam:        2.69 cm 1.47 cm/m   RA Area:     10.60 cm LA Vol (A2C):   44.6 ml 24.33 ml/m  RA Volume:   19.30 ml  10.53 ml/m LA Vol (A4C):   17.5 ml 9.55 ml/m LA Biplane Vol: 28.0 ml 15.28 ml/m   AORTA Ao Root diam: 4.22 cm Ao Asc diam:  3.91 cm MITRAL VALVE               TRICUSPID VALVE MV Area (PHT): 2.66 cm    TR Peak grad:   19.7 mmHg MV Decel Time: 285 msec    TR Vmax:        222.00 cm/s MV E velocity: 55.00 cm/s MV A velocity: 80.20 cm/s  SHUNTS MV E/A ratio:  0.69        Systemic Diam: 2.25 cm Jerel Croitoru MD Electronically signed by Jerel Balding MD Signature Date/Time: 05/06/2024/10:40:06 AM    Final    MR LUMBAR SPINE WO CONTRAST Result Date: 05/06/2024 CLINICAL DATA:  Myelopathy, acute, cervical spine  Myelopathy, chronic, cervical spine; Myelopathy, acute, thoracic spine Myelopathy, chronic, thoracic spine; Low back pain, symptoms persist with > 6 wks treatment Multiple recent falls with back pain and left lower extremity weakness. History of stroke. EXAM: MRI CERVICAL, THORACIC AND LUMBAR SPINE WITHOUT CONTRAST TECHNIQUE: Multiplanar and multiecho pulse sequences of the cervical spine, to include the craniocervical junction and cervicothoracic junction, and thoracic and lumbar spine, were obtained without intravenous contrast. COMPARISON:  CT cervical spine 05/05/2024. MRI lumbar spine 01/13/2024. FINDINGS: Technical note: Despite efforts by the technologist and patient, mild motion artifact is present on today's exam and could not be eliminated. This reduces exam sensitivity and specificity. MRI CERVICAL SPINE FINDINGS Alignment: The cervical alignment is stable with mild straightening and a slight retrolisthesis at C5-6. Vertebrae: No acute or suspicious  osseous findings. Cord: Normal in signal and caliber. Posterior Fossa, vertebral arteries, paraspinal tissues: Visualized portions of the posterior fossa appear unremarkable.Bilateral vertebral artery flow voids. No significant paraspinal findings. Disc levels: C2-3: Preserved disc height. Mild bilateral facet hypertrophy contributes to mild foraminal narrowing bilaterally. No spinal stenosis. C3-4: Mild loss of disc height with mild disc bulging, uncinate spurring and bilateral facet hypertrophy. Mild spinal stenosis without cord deformity. Mild to moderate foraminal narrowing bilaterally. C4-5: Mild loss of disc height with disc bulging, uncinate spurring and facet hypertrophy, greater on the left. Mild spinal stenosis without cord deformity. Mild right and moderate left foraminal narrowing. C5-6: Spondylosis is most advanced at this level with there is loss of disc height with posterior osteophytes covering diffusely bulging disc material. Resulting mild  cord flattening with moderate to severe foraminal narrowing bilaterally. C6-7: Spondylosis with mild loss of disc height and posterior osteophytes covering diffusely bulging disc material. Mild bilateral facet hypertrophy. The CSF surrounding the cord is effaced without significant cord deformity. Mild to moderate right and moderate to severe left foraminal narrowing. C7-T1: Small left paracentral disc protrusion with mild bilateral facet hypertrophy. No cord deformity. Mild foraminal narrowing, greater on the left. MRI THORACIC SPINE FINDINGS Alignment:  Physiologic. Vertebrae: No acute or suspicious osseous findings. Cord:  The thoracic cord appears normal in signal and caliber. Paraspinal and other soft tissues: No significant paraspinal abnormalities. Disc levels: The thoracic disc heights are generally preserved. There is no evidence of significant thoracic disc herniation, spinal stenosis or foraminal narrowing. Scattered paraspinal osteophytes are noted. MRI LUMBAR SPINE FINDINGS Segmentation: There are 5 lumbar type vertebral bodies. Alignment: Stable alignment with straightening and a minimal degenerative anterolisthesis at L4-5. Vertebrae: No worrisome osseous lesion, acute fracture or pars defect. Similar chronic endplate degenerative changes asymmetric to the left at L4-5 with marrow edema extending into the posterior elements. No evidence of discitis or osteomyelitis. The visualized sacroiliac joints appear unremarkable. Conus medullaris: Extends to the L1-2 level and appears normal. Paraspinal and other soft tissues: No significant paraspinal findings. Disc levels: L1-2: Preserved disc height. Prominent anterior osteophytes are again noted. No spinal stenosis or significant foraminal narrowing. L2-3: Preserved disc height with mild disc bulging, facet and ligamentous hypertrophy. Resulting mild multifactorial spinal stenosis with mild right-greater-than-left foraminal narrowing. No definite nerve root  impingement. L3-4: Preserved disc height with annular disc bulging eccentric to the left. Moderate facet and ligamentous hypertrophy. Resulting moderate multifactorial spinal stenosis with mild lateral recess narrowing bilaterally. Mild right and moderate left foraminal narrowing appears unchanged from recent prior MRI. L4-5: Chronic loss of disc height with annular disc bulging, endplate osteophytes and endplate edema asymmetric to the left. Moderate to severe facet and ligamentous hypertrophy contribute to severe multifactorial spinal stenosis, similar to recent prior MRI. There is associated moderate asymmetric narrowing of the left lateral recess and left foramen which appears chronic. L5-S1: Preserved disc height and hydration. Moderate to advanced bilateral facet hypertrophy. No significant central spinal stenosis. There is mild lateral recess and foraminal narrowing bilaterally. IMPRESSION: 1. No acute findings or clear explanation for the patient's symptoms in the cervical, thoracic or lumbar spine. 2. Multilevel cervical spondylosis, most advanced at C5-6 where there is mild cord flattening and moderate to severe foraminal narrowing bilaterally. No abnormal cord signal. 3. No significant thoracic spinal stenosis or foraminal narrowing. 4. Multilevel lumbar spondylosis, similar to previous MRI of 01/13/2024. 5. At L4-5 there is severe multifactorial spinal stenosis and moderate asymmetric narrowing of the left lateral  recess and left foramen. 6. Moderate multifactorial spinal stenosis at L3-4 with mild right and moderate left foraminal narrowing. 7. Mild multifactorial spinal stenosis at L2-3. Electronically Signed   By: Elsie Perone M.D.   On: 05/06/2024 09:01   MR THORACIC SPINE WO CONTRAST Result Date: 05/06/2024 CLINICAL DATA:  Myelopathy, acute, cervical spine Myelopathy, chronic, cervical spine; Myelopathy, acute, thoracic spine Myelopathy, chronic, thoracic spine; Low back pain, symptoms  persist with > 6 wks treatment Multiple recent falls with back pain and left lower extremity weakness. History of stroke. EXAM: MRI CERVICAL, THORACIC AND LUMBAR SPINE WITHOUT CONTRAST TECHNIQUE: Multiplanar and multiecho pulse sequences of the cervical spine, to include the craniocervical junction and cervicothoracic junction, and thoracic and lumbar spine, were obtained without intravenous contrast. COMPARISON:  CT cervical spine 05/05/2024. MRI lumbar spine 01/13/2024. FINDINGS: Technical note: Despite efforts by the technologist and patient, mild motion artifact is present on today's exam and could not be eliminated. This reduces exam sensitivity and specificity. MRI CERVICAL SPINE FINDINGS Alignment: The cervical alignment is stable with mild straightening and a slight retrolisthesis at C5-6. Vertebrae: No acute or suspicious osseous findings. Cord: Normal in signal and caliber. Posterior Fossa, vertebral arteries, paraspinal tissues: Visualized portions of the posterior fossa appear unremarkable.Bilateral vertebral artery flow voids. No significant paraspinal findings. Disc levels: C2-3: Preserved disc height. Mild bilateral facet hypertrophy contributes to mild foraminal narrowing bilaterally. No spinal stenosis. C3-4: Mild loss of disc height with mild disc bulging, uncinate spurring and bilateral facet hypertrophy. Mild spinal stenosis without cord deformity. Mild to moderate foraminal narrowing bilaterally. C4-5: Mild loss of disc height with disc bulging, uncinate spurring and facet hypertrophy, greater on the left. Mild spinal stenosis without cord deformity. Mild right and moderate left foraminal narrowing. C5-6: Spondylosis is most advanced at this level with there is loss of disc height with posterior osteophytes covering diffusely bulging disc material. Resulting mild cord flattening with moderate to severe foraminal narrowing bilaterally. C6-7: Spondylosis with mild loss of disc height and posterior  osteophytes covering diffusely bulging disc material. Mild bilateral facet hypertrophy. The CSF surrounding the cord is effaced without significant cord deformity. Mild to moderate right and moderate to severe left foraminal narrowing. C7-T1: Small left paracentral disc protrusion with mild bilateral facet hypertrophy. No cord deformity. Mild foraminal narrowing, greater on the left. MRI THORACIC SPINE FINDINGS Alignment:  Physiologic. Vertebrae: No acute or suspicious osseous findings. Cord:  The thoracic cord appears normal in signal and caliber. Paraspinal and other soft tissues: No significant paraspinal abnormalities. Disc levels: The thoracic disc heights are generally preserved. There is no evidence of significant thoracic disc herniation, spinal stenosis or foraminal narrowing. Scattered paraspinal osteophytes are noted. MRI LUMBAR SPINE FINDINGS Segmentation: There are 5 lumbar type vertebral bodies. Alignment: Stable alignment with straightening and a minimal degenerative anterolisthesis at L4-5. Vertebrae: No worrisome osseous lesion, acute fracture or pars defect. Similar chronic endplate degenerative changes asymmetric to the left at L4-5 with marrow edema extending into the posterior elements. No evidence of discitis or osteomyelitis. The visualized sacroiliac joints appear unremarkable. Conus medullaris: Extends to the L1-2 level and appears normal. Paraspinal and other soft tissues: No significant paraspinal findings. Disc levels: L1-2: Preserved disc height. Prominent anterior osteophytes are again noted. No spinal stenosis or significant foraminal narrowing. L2-3: Preserved disc height with mild disc bulging, facet and ligamentous hypertrophy. Resulting mild multifactorial spinal stenosis with mild right-greater-than-left foraminal narrowing. No definite nerve root impingement. L3-4: Preserved disc height with annular disc bulging  eccentric to the left. Moderate facet and ligamentous hypertrophy.  Resulting moderate multifactorial spinal stenosis with mild lateral recess narrowing bilaterally. Mild right and moderate left foraminal narrowing appears unchanged from recent prior MRI. L4-5: Chronic loss of disc height with annular disc bulging, endplate osteophytes and endplate edema asymmetric to the left. Moderate to severe facet and ligamentous hypertrophy contribute to severe multifactorial spinal stenosis, similar to recent prior MRI. There is associated moderate asymmetric narrowing of the left lateral recess and left foramen which appears chronic. L5-S1: Preserved disc height and hydration. Moderate to advanced bilateral facet hypertrophy. No significant central spinal stenosis. There is mild lateral recess and foraminal narrowing bilaterally. IMPRESSION: 1. No acute findings or clear explanation for the patient's symptoms in the cervical, thoracic or lumbar spine. 2. Multilevel cervical spondylosis, most advanced at C5-6 where there is mild cord flattening and moderate to severe foraminal narrowing bilaterally. No abnormal cord signal. 3. No significant thoracic spinal stenosis or foraminal narrowing. 4. Multilevel lumbar spondylosis, similar to previous MRI of 01/13/2024. 5. At L4-5 there is severe multifactorial spinal stenosis and moderate asymmetric narrowing of the left lateral recess and left foramen. 6. Moderate multifactorial spinal stenosis at L3-4 with mild right and moderate left foraminal narrowing. 7. Mild multifactorial spinal stenosis at L2-3. Electronically Signed   By: Elsie Perone M.D.   On: 05/06/2024 09:01   MR CERVICAL SPINE WO CONTRAST Result Date: 05/06/2024 CLINICAL DATA:  Myelopathy, acute, cervical spine Myelopathy, chronic, cervical spine; Myelopathy, acute, thoracic spine Myelopathy, chronic, thoracic spine; Low back pain, symptoms persist with > 6 wks treatment Multiple recent falls with back pain and left lower extremity weakness. History of stroke. EXAM: MRI  CERVICAL, THORACIC AND LUMBAR SPINE WITHOUT CONTRAST TECHNIQUE: Multiplanar and multiecho pulse sequences of the cervical spine, to include the craniocervical junction and cervicothoracic junction, and thoracic and lumbar spine, were obtained without intravenous contrast. COMPARISON:  CT cervical spine 05/05/2024. MRI lumbar spine 01/13/2024. FINDINGS: Technical note: Despite efforts by the technologist and patient, mild motion artifact is present on today's exam and could not be eliminated. This reduces exam sensitivity and specificity. MRI CERVICAL SPINE FINDINGS Alignment: The cervical alignment is stable with mild straightening and a slight retrolisthesis at C5-6. Vertebrae: No acute or suspicious osseous findings. Cord: Normal in signal and caliber. Posterior Fossa, vertebral arteries, paraspinal tissues: Visualized portions of the posterior fossa appear unremarkable.Bilateral vertebral artery flow voids. No significant paraspinal findings. Disc levels: C2-3: Preserved disc height. Mild bilateral facet hypertrophy contributes to mild foraminal narrowing bilaterally. No spinal stenosis. C3-4: Mild loss of disc height with mild disc bulging, uncinate spurring and bilateral facet hypertrophy. Mild spinal stenosis without cord deformity. Mild to moderate foraminal narrowing bilaterally. C4-5: Mild loss of disc height with disc bulging, uncinate spurring and facet hypertrophy, greater on the left. Mild spinal stenosis without cord deformity. Mild right and moderate left foraminal narrowing. C5-6: Spondylosis is most advanced at this level with there is loss of disc height with posterior osteophytes covering diffusely bulging disc material. Resulting mild cord flattening with moderate to severe foraminal narrowing bilaterally. C6-7: Spondylosis with mild loss of disc height and posterior osteophytes covering diffusely bulging disc material. Mild bilateral facet hypertrophy. The CSF surrounding the cord is effaced  without significant cord deformity. Mild to moderate right and moderate to severe left foraminal narrowing. C7-T1: Small left paracentral disc protrusion with mild bilateral facet hypertrophy. No cord deformity. Mild foraminal narrowing, greater on the left. MRI THORACIC SPINE FINDINGS Alignment:  Physiologic. Vertebrae: No acute or suspicious osseous findings. Cord:  The thoracic cord appears normal in signal and caliber. Paraspinal and other soft tissues: No significant paraspinal abnormalities. Disc levels: The thoracic disc heights are generally preserved. There is no evidence of significant thoracic disc herniation, spinal stenosis or foraminal narrowing. Scattered paraspinal osteophytes are noted. MRI LUMBAR SPINE FINDINGS Segmentation: There are 5 lumbar type vertebral bodies. Alignment: Stable alignment with straightening and a minimal degenerative anterolisthesis at L4-5. Vertebrae: No worrisome osseous lesion, acute fracture or pars defect. Similar chronic endplate degenerative changes asymmetric to the left at L4-5 with marrow edema extending into the posterior elements. No evidence of discitis or osteomyelitis. The visualized sacroiliac joints appear unremarkable. Conus medullaris: Extends to the L1-2 level and appears normal. Paraspinal and other soft tissues: No significant paraspinal findings. Disc levels: L1-2: Preserved disc height. Prominent anterior osteophytes are again noted. No spinal stenosis or significant foraminal narrowing. L2-3: Preserved disc height with mild disc bulging, facet and ligamentous hypertrophy. Resulting mild multifactorial spinal stenosis with mild right-greater-than-left foraminal narrowing. No definite nerve root impingement. L3-4: Preserved disc height with annular disc bulging eccentric to the left. Moderate facet and ligamentous hypertrophy. Resulting moderate multifactorial spinal stenosis with mild lateral recess narrowing bilaterally. Mild right and moderate left  foraminal narrowing appears unchanged from recent prior MRI. L4-5: Chronic loss of disc height with annular disc bulging, endplate osteophytes and endplate edema asymmetric to the left. Moderate to severe facet and ligamentous hypertrophy contribute to severe multifactorial spinal stenosis, similar to recent prior MRI. There is associated moderate asymmetric narrowing of the left lateral recess and left foramen which appears chronic. L5-S1: Preserved disc height and hydration. Moderate to advanced bilateral facet hypertrophy. No significant central spinal stenosis. There is mild lateral recess and foraminal narrowing bilaterally. IMPRESSION: 1. No acute findings or clear explanation for the patient's symptoms in the cervical, thoracic or lumbar spine. 2. Multilevel cervical spondylosis, most advanced at C5-6 where there is mild cord flattening and moderate to severe foraminal narrowing bilaterally. No abnormal cord signal. 3. No significant thoracic spinal stenosis or foraminal narrowing. 4. Multilevel lumbar spondylosis, similar to previous MRI of 01/13/2024. 5. At L4-5 there is severe multifactorial spinal stenosis and moderate asymmetric narrowing of the left lateral recess and left foramen. 6. Moderate multifactorial spinal stenosis at L3-4 with mild right and moderate left foraminal narrowing. 7. Mild multifactorial spinal stenosis at L2-3. Electronically Signed   By: Elsie Perone M.D.   On: 05/06/2024 09:01   CT ANGIO HEAD NECK W WO CM Result Date: 05/06/2024 CLINICAL DATA:  Initial evaluation for acute neuro deficit, stroke. EXAM: CT ANGIOGRAPHY HEAD AND NECK WITH AND WITHOUT CONTRAST TECHNIQUE: Multidetector CT imaging of the head and neck was performed using the standard protocol during bolus administration of intravenous contrast. Multiplanar CT image reconstructions and MIPs were obtained to evaluate the vascular anatomy. Carotid stenosis measurements (when applicable) are obtained utilizing NASCET  criteria, using the distal internal carotid diameter as the denominator. RADIATION DOSE REDUCTION: This exam was performed according to the departmental dose-optimization program which includes automated exposure control, adjustment of the mA and/or kV according to patient size and/or use of iterative reconstruction technique. CONTRAST:  75mL OMNIPAQUE  IOHEXOL  350 MG/ML SOLN COMPARISON:  Comparison made with MRI from earlier the same day as well as prior exam from 01/12/2024. FINDINGS: CTA NECK FINDINGS Aortic arch: Aortic arch within normal limits for caliber with standard branch pattern. Mild aortic atherosclerosis. No stenosis about the origin of  the great vessels. Right carotid system: Right common and internal carotid arteries are patent without dissection. Mild atheromatous irregularity about the l right carotid bulb without hemodynamically significant stenosis. Left carotid system: Left common and internal carotid arteries are patent without dissection. Eccentric mixed plaque about the left carotid bulb without hemodynamically significant greater than 50% stenosis. Vertebral arteries: Left vertebral artery slightly dominant. Vertebral arteries patent without stenosis or dissection. Skeleton: No worrisome osseous lesions. Moderate spondylosis present at C5-6 and C6-7. Other neck: No other acute finding. Upper chest: No other acute finding. Review of the MIP images confirms the above findings CTA HEAD FINDINGS Anterior circulation: Both internal carotid arteries widely patent to the termini without stenosis. A1 segments widely patent. Normal anterior communicating artery complex. Both anterior cerebral arteries widely patent to their distal aspects without stenosis. No M1 stenosis or occlusion. Normal MCA bifurcations. Distal MCA branches well perfused and symmetric. Posterior circulation: Both V4 segments patent without stenosis. Left PICA patent. Right PICA not well seen. Basilar patent without stenosis.  Superior cerebral arteries patent bilaterally. Right PCA supplied via the basilar. Predominant fetal type origin left PCA. Both PCAs patent without stenosis. Venous sinuses: Patent allowing for timing the contrast bolus. Anatomic variants: As above.  No aneurysm. Review of the MIP images confirms the above findings IMPRESSION: 1. Negative CTA for large vessel occlusion or other emergent finding. 2. Mild atheromatous change about the carotid bifurcations without hemodynamically significant stenosis. Aortic Atherosclerosis (ICD10-I70.0). Electronically Signed   By: Morene Hoard M.D.   On: 05/06/2024 04:35   MR BRAIN WO CONTRAST Result Date: 05/06/2024 CLINICAL DATA:  Initial evaluation for multiple falls, history of stroke. EXAM: MRI HEAD WITHOUT CONTRAST TECHNIQUE: Multiplanar, multiecho pulse sequences of the brain and surrounding structures were obtained without intravenous contrast. COMPARISON:  ET from 05/05/2024. FINDINGS: Brain: Age-related cerebral atrophy. Patchy T2/FLAIR hyperintensity involving the supratentorial cerebral white matter, consistent with chronic small vessel ischemic disease. 2 cm acute ischemic nonhemorrhagic infarcts seen involving the right basal ganglia of/corona radiata (series 5, image 83). No significant mass effect. Gray-white matter differentiation otherwise maintained. No acute or significant chronic intracranial blood products. No mass lesion, midline shift or mass effect. Ventricular prominence related global parenchymal volume loss without hydrocephalus. No extra-axial fluid collection. Pituitary gland within normal limits. Vascular: Major intracranial vascular flow voids are maintained. Skull and upper cervical spine: Craniocervical junction within normal limits. Bone marrow signal intensity normal. No scalp soft tissue abnormality. Sinuses/Orbits: Prior bilateral ocular lens replacement. Scattered mucosal thickening noted about the ethmoidal air cells and maxillary  sinuses. Few scattered superimposed left maxillary sinus retention cyst noted. No significant mastoid effusion. Other: None. IMPRESSION: 1. 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 2. Underlying age-related cerebral atrophy with chronic small vessel ischemic disease. Electronically Signed   By: Morene Hoard M.D.   On: 05/06/2024 01:08   DG Lumbar Spine Complete Result Date: 05/05/2024 EXAM: 4 OR MORE VIEW(S) XRAY OF THE LUMBAR SPINE 05/05/2024 03:57:00 PM COMPARISON: Lumbar spine x-ray from 11/12/2017. CLINICAL HISTORY: falls FINDINGS: LUMBAR SPINE: BONES: No acute fracture. No aggressive appearing osseous lesion. Alignment is normal. Prostate radiotherapy seeds are present. DISCS AND DEGENERATIVE CHANGES: Moderate degenerative changes at L4-L5 with mild disc space narrowing, vacuum disc phenomenon and endplate sclerosis which has increased compared to 2019. SOFT TISSUES: No acute abnormality. IMPRESSION: 1. No acute abnormality of the lumbar spine related to the falls. 2. Moderate degenerative changes at L4-L5 have increased compared to 2019. Electronically signed  by: Greig Pique MD 05/05/2024 04:26 PM EST RP Workstation: [id number]   CT Head Wo Contrast Result Date: 05/05/2024 CLINICAL DATA:  Head and neck trauma EXAM: CT HEAD WITHOUT CONTRAST CT CERVICAL SPINE WITHOUT CONTRAST TECHNIQUE: Multidetector CT imaging of the head and cervical spine was performed following the standard protocol without intravenous contrast. Multiplanar CT image reconstructions of the cervical spine were also generated. RADIATION DOSE REDUCTION: This exam was performed according to the departmental dose-optimization program which includes automated exposure control, adjustment of the mA and/or kV according to patient size and/or use of iterative reconstruction technique. COMPARISON:  None Available. FINDINGS: CT HEAD FINDINGS Brain: No evidence of acute infarction, hemorrhage,  hydrocephalus, extra-axial collection or mass lesion/mass effect. Periventricular and deep white matter hypodensity. Vascular: No hyperdense vessel or unexpected calcification. Skull: Normal. Negative for fracture or focal lesion. Sinuses/Orbits: No acute finding. Other: None. CT CERVICAL SPINE FINDINGS Alignment: Degenerative straightening of the normal cervical lordosis. Skull base and vertebrae: No acute fracture. No primary bone lesion or focal pathologic process. Soft tissues and spinal canal: No prevertebral fluid or swelling. No visible canal hematoma. Disc levels: Moderate disc degenerative change of C5 through C7 with otherwise intact disc spaces. Upper chest: Negative. Other: None. IMPRESSION: 1. No acute intracranial pathology. Small-vessel white matter disease. 2. No fracture or static subluxation of the cervical spine. 3. Moderate disc degenerative change of C5 through C7 with otherwise intact disc spaces. Electronically Signed   By: Marolyn JONETTA Jaksch M.D.   On: 05/05/2024 16:03   CT Cervical Spine Wo Contrast Result Date: 05/05/2024 CLINICAL DATA:  Head and neck trauma EXAM: CT HEAD WITHOUT CONTRAST CT CERVICAL SPINE WITHOUT CONTRAST TECHNIQUE: Multidetector CT imaging of the head and cervical spine was performed following the standard protocol without intravenous contrast. Multiplanar CT image reconstructions of the cervical spine were also generated. RADIATION DOSE REDUCTION: This exam was performed according to the departmental dose-optimization program which includes automated exposure control, adjustment of the mA and/or kV according to patient size and/or use of iterative reconstruction technique. COMPARISON:  None Available. FINDINGS: CT HEAD FINDINGS Brain: No evidence of acute infarction, hemorrhage, hydrocephalus, extra-axial collection or mass lesion/mass effect. Periventricular and deep white matter hypodensity. Vascular: No hyperdense vessel or unexpected calcification. Skull: Normal.  Negative for fracture or focal lesion. Sinuses/Orbits: No acute finding. Other: None. CT CERVICAL SPINE FINDINGS Alignment: Degenerative straightening of the normal cervical lordosis. Skull base and vertebrae: No acute fracture. No primary bone lesion or focal pathologic process. Soft tissues and spinal canal: No prevertebral fluid or swelling. No visible canal hematoma. Disc levels: Moderate disc degenerative change of C5 through C7 with otherwise intact disc spaces. Upper chest: Negative. Other: None. IMPRESSION: 1. No acute intracranial pathology. Small-vessel white matter disease. 2. No fracture or static subluxation of the cervical spine. 3. Moderate disc degenerative change of C5 through C7 with otherwise intact disc spaces. Electronically Signed   By: Marolyn JONETTA Jaksch M.D.   On: 05/05/2024 16:03     TODAY-DAY OF DISCHARGE:  Subjective:   Norleen Small today has no headache,no chest abdominal pain,no new weakness tingling or numbness, feels much better wants to go home today.  Objective:   Blood pressure 111/78, pulse 71, temperature 98 F (36.7 C), temperature source Axillary, resp. rate 16, height 5' 6 (1.676 m), weight 73.9 kg, SpO2 98%.  Intake/Output Summary (Last 24 hours) at 05/08/2024 0955 Last data filed at 05/08/2024 0408 Gross per 24 hour  Intake --  Output 1000 ml  Net -1000 ml   Filed Weights   05/06/24 0500  Weight: 73.9 kg    Exam: Awake Alert, Oriented *3, No new F.N deficits, Normal affect [place].AT,PERRAL Supple Neck,No JVD, No cervical lymphadenopathy appriciated.  Symmetrical Chest wall movement, Good air movement bilaterally, CTAB RRR,No Gallops,Rubs or new Murmurs, No Parasternal Heave +ve B.Sounds, Abd Soft, Non tender, No organomegaly appriciated, No rebound -guarding or rigidity. No Cyanosis, Clubbing or edema, No new Rash or bruise   PERTINENT RADIOLOGIC STUDIES: No results found.   PERTINENT LAB RESULTS: CBC: Recent Labs    05/05/24 1530  05/07/24 0315  WBC 5.6 3.5*  HGB 13.7 12.9*  HCT 40.4 38.4*  PLT 246 230   CMET CMP     Component Value Date/Time   NA 137 05/07/2024 0315   NA 138 05/03/2015 1444   K 3.9 05/07/2024 0315   CL 102 05/07/2024 0315   CO2 24 05/07/2024 0315   GLUCOSE 185 (H) 05/07/2024 0315   BUN 14 05/07/2024 0315   BUN 10 05/03/2015 1444   CREATININE 0.91 05/07/2024 0315   CALCIUM  9.0 05/07/2024 0315   PROT 7.4 01/13/2024 0500   ALBUMIN 3.7 01/13/2024 0500   AST 26 01/13/2024 0500   ALT 12 01/13/2024 0500   ALKPHOS 65 01/13/2024 0500   BILITOT 1.1 01/13/2024 0500   GFRNONAA >60 05/07/2024 0315    GFR Estimated Creatinine Clearance: 74 mL/min (by C-G formula based on SCr of 0.91 mg/dL). No results for input(s): LIPASE, AMYLASE in the last 72 hours. No results for input(s): CKTOTAL, CKMB, CKMBINDEX, TROPONINI in the last 72 hours. Invalid input(s): POCBNP No results for input(s): DDIMER in the last 72 hours. Recent Labs    05/06/24 0546  HGBA1C 6.8*   Recent Labs    05/06/24 0546  CHOL 174  HDL 45  LDLCALC 111*  TRIG 90  CHOLHDL 3.9   No results for input(s): TSH, T4TOTAL, T3FREE, THYROIDAB in the last 72 hours.  Invalid input(s): FREET3 No results for input(s): VITAMINB12, FOLATE, FERRITIN, TIBC, IRON, RETICCTPCT in the last 72 hours. Coags: No results for input(s): INR in the last 72 hours.  Invalid input(s): PT Microbiology: No results found for this or any previous visit (from the past 240 hours).  FURTHER DISCHARGE INSTRUCTIONS:  Get Medicines reviewed and adjusted: Please take all your medications with you for your next visit with your Primary MD  Laboratory/radiological data: Please request your Primary MD to go over all hospital tests and procedure/radiological results at the follow up, please ask your Primary MD to get all Hospital records sent to his/her office.  In some cases, they will be blood work, cultures and  biopsy results pending at the time of your discharge. Please request that your primary care M.D. goes through all the records of your hospital data and follows up on these results.  Also Note the following: If you experience worsening of your admission symptoms, develop shortness of breath, life threatening emergency, suicidal or homicidal thoughts you must seek medical attention immediately by calling 911 or calling your MD immediately  if symptoms less severe.  You must read complete instructions/literature along with all the possible adverse reactions/side effects for all the Medicines you take and that have been prescribed to you. Take any new Medicines after you have completely understood and accpet all the possible adverse reactions/side effects.   Do not drive when taking Pain medications or sleeping medications (Benzodaizepines)  Do not take more than prescribed Pain, Sleep and Anxiety  Medications. It is not advisable to combine anxiety,sleep and pain medications without talking with your primary care practitioner  Special Instructions: If you have smoked or chewed Tobacco  in the last 2 yrs please stop smoking, stop any regular Alcohol  and or any Recreational drug use.  Wear Seat belts while driving.  Please note: You were cared for by a hospitalist during your hospital stay. Once you are discharged, your primary care physician will handle any further medical issues. Please note that NO REFILLS for any discharge medications will be authorized once you are discharged, as it is imperative that you return to your primary care physician (or establish a relationship with a primary care physician if you do not have one) for your post hospital discharge needs so that they can reassess your need for medications and monitor your lab values.  Total Time spent coordinating discharge including counseling, education and face to face time equals greater than 30 minutes.  SignedBETHA Donalda Applebaum 05/08/2024 9:55 AM

## 2024-05-20 ENCOUNTER — Other Ambulatory Visit (HOSPITAL_COMMUNITY): Payer: Self-pay

## 2024-06-04 NOTE — Progress Notes (Signed)
 [facility] Worker Note Stroke Post Discharge Follow-Up  Douglas Stewart [id number]   Contact Type:  Telephone Encounter Date: 06/04/2024  Outreach Project:  Stroke post discharge follow-up Managed Medicaid Plan Participant:   PCP: Yes - See Care Teams in patient chart Payor Status: Does the patient have health insurance (Y/N): Yes Payor Name: : [facility] Worker Documentation     Row Name 06/04/24 1047     Post-Stroke CHW Follow-Up Telephone Call   Discharge Date 05/08/24   Discharge Location [facility]   Program RN notified of new or worsening symptoms N/A   Depression Screening Exception: Other- indicate reason in comment box  Patient in SNF unable to speak directly to him   PHQ2-9 >4 Program RN notified of new or worsening symptoms. N/A     Functional Questionnaire - ADL's   Bathing Dependent   Dressing Dependent   Meal Prep Dependent   Eating Independent   Maintaining Continence Independent   Ambulation/Transferring Dependent   Medication Management Dependent   Does the patient have support for these ADL's Yes  Patient is in a SNF     Post-Discharge Instructions Reviewed   Did the patient receive and understand the discharge instructions provided? Yes   Did the patient obtain their post-discharge medications? Yes     Follow-Up Appointments Review   PCP Hospital F/U appointment scheduled? Yes  Patient has a PCP within the SNF facility   Did the patient keep PCP discharge appointment? Yes   Specialist Hospital F/U appointment scheduled? Yes   Did the patient keep the specialist appointment Yes   Did the patient have referral(s) to PT/OT/SLP post discharge? PT;SLP;OT   Has the patient been given appointments for the referrals? Yes   Did the patient keep their PT/OT/SLP appointment(s)? Yes  Per nurse in SNF patient has been attending all PT, OT, and SLP appts     Risk Factor Follow-Up   Is the pateint diabetic? Yes   Does the patient have  the medications/tools needed to manage their diabetes? Yes   Does the patient have hypertension? Yes   Does the patient have the medications/tools needed to manage their hypertension? Yes   Does patient smoke, use tobacco product, and/or vape?  No   Does the patient have nutritional needs/restrictions? Yes   Does the patient have nutritional needs/restrictions? Diabetic diet and heart healthy diet   Does the patient exercise IF recommended by the discharge care team? Yes  Lower extremity exercises   Can the patient verbalize understanding of the actions needed to manage their current condition? Yes   Can the patient verbalize actions needed to address his/her current risk factors to prevent another stroke?  Yes   Would the patient like helping finding additional community resources and/or stroke support groups? No  SNF assists patient with any additional needs that may be neede dupon discharge       Past Medical History:  Diagnosis Date   History of stroke 05/06/2024   Hypertension    Prostate cancer [facility])    Social History   Substance and Sexual Activity  Alcohol Use No   Alcohol/week: 0.0 standard drinks of alcohol   Social History   Substance and Sexual Activity  Drug Use No   Social History   Tobacco Use  Smoking Status Former   Current packs/day: 0.00   Types: Cigarettes   Quit date: 06/27/1999   Years since quitting: 24.9  Smokeless Tobacco Never  SDOH Screenings   Food Insecurity: No Food Insecurity (05/06/2024)  Housing: Low Risk  (05/06/2024)  Transportation Needs: No Transportation Needs (05/06/2024)  Utilities: Not At Risk (05/06/2024)  Tobacco Use: Medium Risk (05/06/2024)   Referrals (if applicable):        Education:    Limiting Factors:   Unable to speak with patient directly due to being in a SNF  Follow-up:   Initial f/u  Follow-up Type:   Telephone  Per health equity protocol an additional follow up is to be scheduled   Neetu Carrozza A  Reynaldo Rossman

## 2024-06-18 ENCOUNTER — Ambulatory Visit: Admitting: Diagnostic Neuroimaging

## 2024-06-18 ENCOUNTER — Encounter: Payer: Self-pay | Admitting: Diagnostic Neuroimaging

## 2024-06-18 VITALS — BP 118/81 | HR 88 | Ht 66.0 in

## 2024-06-18 DIAGNOSIS — M48062 Spinal stenosis, lumbar region with neurogenic claudication: Secondary | ICD-10-CM | POA: Diagnosis not present

## 2024-06-18 DIAGNOSIS — I639 Cerebral infarction, unspecified: Secondary | ICD-10-CM | POA: Diagnosis not present

## 2024-06-18 DIAGNOSIS — E538 Deficiency of other specified B group vitamins: Secondary | ICD-10-CM | POA: Diagnosis not present

## 2024-06-18 NOTE — Progress Notes (Signed)
 "  [facility]  PATIENT: Douglas Stewart DOB: May 02, 1961  REFERRING CLINICIAN: Jerri Pfeiffer, MD HISTORY FROM: patient  REASON FOR VISIT: new consult   HISTORICAL  CHIEF COMPLAINT:  Chief Complaint  Patient presents with   RM 7     Patient is here alone for hospital follow-up for stroke - has been having issues with pain in his lower back and swelling in left leg and numbness.  Left side has been affected by stroke     HISTORY OF PRESENT ILLNESS:   UPDATE (06/18/24, VRP): Since last visit, doing well until 05/06/24, went back to hospital for left sided weakness. Found to have new right basal ganglia infarction. Stroke workup completed. Now at [facility]. Tolerating meds.   PRIOR HPI (04/03/24, VRP): 63 year old male with hypertension, hyperlipidemia, here for evaluation of hospital stroke follow-up.  01/12/2024 patient presented to the hospital due to worsening gait and balance difficulty.  MRI of the brain was obtained to which showed a small acute to subacute area of focal ischemia in the left frontal operculum.  He was admitted for further workup.  Neurology consultation was obtained.  He was noted to have bilateral lower extremity numbness and weakness for more than a year.  He was found to have Stewart spinal stenosis at L4-5 level and additional multilevel foraminal stenoses.  Stroke was felt to be an incidental finding due to small vessel disease.  Since that time patient has been at home.  Symptoms of lower extremity weakness continue.  He has seen neurosurgery consult set up for next week to address his low back issue.  Sometimes uses a cane at home.  He had another fall a few days ago.   REVIEW OF SYSTEMS: Full 14 system review of systems performed and negative with exception of: as per HPI.  ALLERGIES: No Known Allergies  HOME MEDICATIONS: Outpatient Medications Prior to Visit  Medication Sig Dispense Refill   acetaminophen  (TYLENOL ) 325 MG tablet  Take 650 mg by mouth every 4 (four) hours as needed.     aspirin  EC 81 MG tablet Take 1 tablet (81 mg total) by mouth daily. Swallow whole.     atorvastatin  (LIPITOR) 80 MG tablet Take 1 tablet (80 mg total) by mouth daily. 90 tablet 0   gabapentin (NEURONTIN) 100 MG capsule Take 100 mg by mouth 3 (three) times daily.     lisinopril  (ZESTRIL ) 10 MG tablet Take 1 tablet (10 mg total) by mouth daily. 90 tablet 0   metformin (FORTAMET) 500 MG (OSM) 24 hr tablet Take 500 mg by mouth 2 (two) times daily with a meal.     No facility-administered medications prior to visit.    PAST MEDICAL HISTORY: Past Medical History:  Diagnosis Date   History of stroke 05/06/2024   Hypertension    Prostate cancer (HCC)     PAST SURGICAL HISTORY: Past Surgical History:  Procedure Laterality Date   no surgical history      FAMILY HISTORY: Family History  Problem Relation Age of Onset   Stroke Neg Hx    Migraines Neg Hx    Seizures Neg Hx     SOCIAL HISTORY: Social History   Socioeconomic History   Marital status: Married    Spouse name: Kyra   Number of children: 1   Years of education: 12+   Highest education level: Not on file  Occupational History   Occupation: Student   Tobacco Use   Smoking status: Former  Current packs/day: 0.00    Types: Cigarettes    Quit date: 06/27/1999    Years since quitting: 24.9   Smokeless tobacco: Never  Substance and Sexual Activity   Alcohol use: No    Alcohol/week: 0.0 standard drinks of alcohol   Drug use: No   Sexual activity: Not on file  Other Topics Concern   Not on file  Social History Narrative   Lives at home with friends   Caffeine use: Drinks coffee occassionally     Social Drivers of Health   Tobacco Use: Medium Risk (06/18/2024)   Patient History    Smoking Tobacco Use: Former    Smokeless Tobacco Use: Never    Passive Exposure: Not on Actuary Strain: Not on file  Food Insecurity: No Food Insecurity  (05/06/2024)   Epic    Worried About Programme Researcher, Broadcasting/film/video in the Last Year: Never true    Ran Out of Food in the Last Year: Never true  Transportation Needs: No Transportation Needs (05/06/2024)   Epic    Lack of Transportation (Medical): No    Lack of Transportation (Non-Medical): No  Physical Activity: Not on file  Stress: Not on file  Social Connections: Not on file  Intimate Partner Violence: Not At Risk (05/06/2024)   Epic    Fear of Current or Ex-Partner: No    Emotionally Abused: No    Physically Abused: No    Sexually Abused: No  Depression (PHQ2-9): Not on file  Alcohol Screen: Not on file  Housing: Low Risk (05/06/2024)   Epic    Unable to Pay for Housing in the Last Year: No    Number of Times Moved in the Last Year: 0    Homeless in the Last Year: No  Utilities: Not At Risk (05/06/2024)   Epic    Threatened with loss of utilities: No  Health Literacy: Not on file     PHYSICAL EXAM  GENERAL EXAM/CONSTITUTIONAL: Vitals:  Vitals:   06/18/24 1113  BP: 118/81  Pulse: 88  Height: 5' 6 (1.676 m)   Body mass index is 26.3 kg/m. Wt Readings from Last 3 Encounters:  05/06/24 162 lb 14.7 oz (73.9 kg)  04/03/24 163 lb (73.9 kg)  01/12/24 150 lb (68 kg)   Patient is in no distress; well developed, nourished and groomed; neck is supple  CARDIOVASCULAR: Examination of carotid arteries is normal; no carotid bruits Regular rate and rhythm, no murmurs Examination of peripheral vascular system by observation and palpation is normal  EYES: Ophthalmoscopic exam of optic discs and posterior segments is normal; no papilledema or hemorrhages No results found.  MUSCULOSKELETAL: Gait, strength, tone, movements noted in Neurologic exam below  NEUROLOGIC: MENTAL STATUS:      No data to display         awake, alert, oriented to person, place and time recent and remote memory intact normal attention and concentration language fluent, comprehension intact, naming  intact fund of knowledge appropriate  CRANIAL NERVE:  2nd - no papilledema on fundoscopic exam 2nd, 3rd, 4th, 6th - pupils equal and reactive to light, visual fields full to confrontation, extraocular muscles intact, no nystagmus 5th - facial sensation symmetric 7th - facial strength symmetric 8th - hearing intact 9th - palate elevates symmetrically, uvula midline 11th - shoulder shrug symmetric 12th - tongue protrusion midline  MOTOR:  normal bulk and tone, full strength in the BUE, BLE --> EXCEPT LUE 4+; BILATERAL HF 4; RIGHT DF 4+;  LEFT DF 4  SENSORY:  normal and symmetric to light touch, temperature, vibration  COORDINATION:  finger-nose-finger, fine finger movements --> SLIGHTLY SLOW ON LEFT SIDE  REFLEXES:  deep tendon reflexes TRACE and symmetric  GAIT/STATION:  IN WHEELCHAIR     DIAGNOSTIC DATA (LABS, IMAGING, TESTING) - I reviewed patient records, labs, notes, testing and imaging myself where available.  Lab Results  Component Value Date   WBC 3.5 (L) 05/07/2024   HGB 12.9 (L) 05/07/2024   HCT 38.4 (L) 05/07/2024   MCV 89.7 05/07/2024   PLT 230 05/07/2024      Component Value Date/Time   NA 137 05/07/2024 0315   NA 138 05/03/2015 1444   K 3.9 05/07/2024 0315   CL 102 05/07/2024 0315   CO2 24 05/07/2024 0315   GLUCOSE 185 (H) 05/07/2024 0315   BUN 14 05/07/2024 0315   BUN 10 05/03/2015 1444   CREATININE 0.91 05/07/2024 0315   CALCIUM  9.0 05/07/2024 0315   PROT 7.4 01/13/2024 0500   ALBUMIN 3.7 01/13/2024 0500   AST 26 01/13/2024 0500   ALT 12 01/13/2024 0500   ALKPHOS 65 01/13/2024 0500   BILITOT 1.1 01/13/2024 0500   GFRNONAA >60 05/07/2024 0315   GFRAA >60 01/27/2018 1411   Lab Results  Component Value Date   CHOL 174 05/06/2024   HDL 45 05/06/2024   LDLCALC 111 (H) 05/06/2024   TRIG 90 05/06/2024   CHOLHDL 3.9 05/06/2024   Lab Results  Component Value Date   HGBA1C 6.8 (H) 05/06/2024   Lab Results  Component Value Date    VITAMINB12 176 (L) 01/13/2024   Lab Results  Component Value Date   TSH 0.879 01/12/2024    01/13/24 MRI lumbar spine [I reviewed images myself and agree with interpretation. -VRP]  1. Chronic lumbar spine degeneration. Chronic but progressed and Stewart multifactorial spinal, lateral recess, and left foraminal stenosis at L4-L5. Query left L4 and/or bilateral L5 radiculitis. 2. Stable Moderate multifactorial spinal stenosis at L3-L4 since 2019. But moderate to Stewart Left foraminal stenosis there appears progressed. Query left L3 radiculitis. 3. Stable mild spinal stenosis at L2-L3.  05/06/24 MRI brain [I reviewed images myself and agree with interpretation. -VRP]  1. 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 2. Underlying age-related cerebral atrophy with chronic small vessel ischemic disease.    ASSESSMENT AND PLAN  63 y.o. year old male here with:   Dx:  1. Cerebrovascular accident (CVA), unspecified mechanism (HCC)   2. Spinal stenosis of lumbar region with neurogenic claudication   3. B12 deficiency        PLAN:  63 y.o. male with history of HTN, prostate cancer with:    Stroke:  right basal ganglia / corona radiata infarction, etiology: Small vessel disease Code Stroke CT head no acute abnormality CTA head & neck Negative CTA for large vessel occlusion or other emergent finding. Mild atheromatous change about the carotid bifurcations without hemodynamically significant stenosis. MRI  2 cm acute ischemic nonhemorrhagic right basal ganglia/corona radiata infarct. 2D Echo EF 40-45%, stable from 4 months ago LDL 111 HgbA1c 6.8 UDS negative VTE prophylaxis - Lovenox  continue aspirin  81 mg daily   Hypertension continue lisinopril  10mg  daily   Hyperlipidemia LDL 111, goal < 70 continue atorvastatin  80mg  daily   Diabetes type II Controlled HgbA1c 6.8, goal < 7.0 continue metformin 500mg  daily   B12 deficiency B12  deficiency, continue supplement   Gait difficulty - likely due to Stewart lumbar spinal  stenosis + B12 deficiency - follow up with neurosurgery consult for lumbar spinal stenosis evaluation   No orders of the defined types were placed in this encounter.  No orders of the defined types were placed in this encounter.  No follow-ups on file.  I reviewed images, labs, notes, records myself. I summarized findings and reviewed with patient, for this high risk condition (Stewart lumbar spinal stenosis, stroke) requiring high complexity decision making.   EDUARD FABIENE HANLON, MD 06/18/2024, 12:21 PM Certified in Neurology, Neurophysiology and Neuroimaging  [facility] [address], [address] [place], [place] [address] [phone]  "

## 2024-06-18 NOTE — Patient Instructions (Signed)
" °  Stroke:  right basal ganglia / corona radiata infarction, etiology: Small vessel disease Code Stroke CT head no acute abnormality CTA head & neck Negative CTA for large vessel occlusion or other emergent finding. Mild atheromatous change about the carotid bifurcations without hemodynamically significant stenosis. MRI  2 cm acute ischemic nonhemorrhagic right basal ganglia/corona radiata infarct. 2D Echo EF 40-45%, stable from 4 months ago LDL 111 HgbA1c 6.8 UDS negative VTE prophylaxis - Lovenox  continue aspirin  81 mg daily   Hypertension continue lisinopril  10mg  daily   Hyperlipidemia LDL 111, goal < 70 continue atorvastatin  80mg  daily   Diabetes type II Controlled HgbA1c 6.8, goal < 7.0 continue metformin 500mg  daily   B12 deficiency B12 deficiency, continue supplement   Gait difficulty - likely due to severe lumbar spinal stenosis + B12 deficiency - follow up with neurosurgery consult for lumbar spinal stenosis evaluation "
